# Patient Record
Sex: Male | Born: 1980 | Race: White | Hispanic: No | Marital: Single | Smoking: Never smoker
Health system: Southern US, Community
[De-identification: ages and names within clinical notes are randomized; demographics above are authoritative.]

## PROBLEM LIST (undated history)

## (undated) DIAGNOSIS — E039 Hypothyroidism, unspecified: Secondary | ICD-10-CM

## (undated) DIAGNOSIS — I10 Essential (primary) hypertension: Secondary | ICD-10-CM

## (undated) HISTORY — DX: Essential (primary) hypertension: I10

---

## 2011-04-23 ENCOUNTER — Ambulatory Visit (HOSPITAL_BASED_OUTPATIENT_CLINIC_OR_DEPARTMENT_OTHER)
Admission: RE | Admit: 2011-04-23 | Discharge: 2011-04-23 | Disposition: A | Discharge status: Home / Self Care | Payer: Commercial Indemnity | Source: Ambulatory Visit | Attending: Urology | Admitting: Urology

## 2011-04-23 DIAGNOSIS — Z01812 Encounter for preprocedural laboratory examination: Secondary | ICD-10-CM | POA: Insufficient documentation

## 2011-04-23 DIAGNOSIS — N4601 Organic azoospermia: Secondary | ICD-10-CM | POA: Insufficient documentation

## 2011-04-23 DIAGNOSIS — N468 Other male infertility: Secondary | ICD-10-CM | POA: Insufficient documentation

## 2011-04-23 LAB — POCT HEMOGLOBIN-HEMACUE: Hemoglobin: 16.9 g/dL (ref 13.0–17.0)

## 2011-05-06 NOTE — Op Note (Signed)
Todd Obrien, Todd Obrien               ACCOUNT NO.:  [id number]  MEDICAL RECORD NO.:  [id number]  LOCATION:                                 FACILITY:  PHYSICIAN:  Adric Wrede C. Vernie Ammons, M.D.  DATE OF BIRTH:  [birth year]  DATE OF PROCEDURE:  04/23/2011 DATE OF DISCHARGE:                              OPERATIVE REPORT   DATE OF OPERATION:  April 23, 2011.  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  PROCEDURE: 1. Transrectal ultrasound with interpretation. 2. Attempted transrectal ultrasound-guided seminal vesicle aspiration.  SURGEON:  [place] C. Vernie Ammons, MD  ANESTHESIA:  General.  BLOOD LOSS:  Minimal.  DRAINS:  None.  SPECIMENS:  None.  COMPLICATIONS:  None.  INDICATIONS:  The patient is a 30 year old male who was found to be azoospermic.  He had a normal pituitary-gonadal axis with a specific finding of normal FSH, LH and testosterone levels.  His ejaculate volumes are less than 1 mL.  His post ejaculate semen analysis revealed no evidence of sperm.  No abnormality was noted on physical examination. We therefore discussed seminal vesicle aspiration and seminal vesiculography with unroofing of ejaculatory duct obstruction transurethrally, if found.  The risks, complications, alternatives and limitations to this procedure were discussed.  The patient understands and has elected to proceed.  DESCRIPTION OF OPERATION:  After informed consent, the patient was brought to the major OR, placed on the table, administered general anesthesia and then moved to the dorsal lithotomy position.  I used a 10 MHz transrectal ultrasound probe and placed this within the rectum and performed transrectal ultrasound of the prostate and seminal vesicles. I found his prostate appeared normal in size for his age.  And I noted no midline cystic  structures throughout the prostate.  The seminal vesicles were visualized but were noted to be nondilated.  The 16-gauge transrectal ultrasound biopsy needle with stylet was then introduced, and under transrectal ultrasound guidance, I placed this into the area of the seminal vesicle and removed the stylet.  A syringe was then attached and I attempted to obtain fluid from the seminal vesicle, first from the left and then the right side.  Both sides revealed no significant fluid that could be obtained, although the seminal vesicles were quite small but present, and due to their nondilated nature, I was unable to obtain any fluid for microscopic evaluation.  I therefore removed the transrectal ultrasound probe and repeated a DRE and noted the prostate to be normal, no seminal vesicles were palpable, and no midline cystic structures or masses were evident to exam.  There was also no blood noted at the end of the procedure.  I discussed with the patient and his wife the findings and the fact that at this point I did not recommend proceeding with a testis biopsy or vasogram as they needed to understand the options, and if a biopsy was performed, I felt it would be advantageous to have this done at a location where a sperm could be cryopreserved if found at the time of his testis biopsy, for consideration of ICSI.  He  tolerated the procedure well and was given a prescription for Levaquin 500 mg daily for 2 days.     Evyn Putzier C. Vernie Ammons, M.D.     MCO/MEDQ  D:  04/23/2011  T:  04/23/2011  Job:  [id number]  Electronically Signed by Ihor Gully M.D. on 05/06/2011 04:23:27 AM

## 2011-08-24 HISTORY — PX: FOOT SURGERY: SHX648

## 2011-09-12 ENCOUNTER — Encounter (HOSPITAL_COMMUNITY): Payer: Self-pay | Admitting: *Deleted

## 2011-09-12 ENCOUNTER — Emergency Department (HOSPITAL_COMMUNITY)
Admission: EM | Admit: 2011-09-12 | Discharge: 2011-09-12 | Disposition: A | Discharge status: Home / Self Care | Payer: Commercial Indemnity | Source: Home / Self Care | Attending: Family Medicine | Admitting: Family Medicine

## 2011-09-12 ENCOUNTER — Emergency Department (INDEPENDENT_AMBULATORY_CARE_PROVIDER_SITE_OTHER): Payer: Commercial Indemnity

## 2011-09-12 DIAGNOSIS — R059 Cough, unspecified: Secondary | ICD-10-CM

## 2011-09-12 DIAGNOSIS — J111 Influenza due to unidentified influenza virus with other respiratory manifestations: Secondary | ICD-10-CM

## 2011-09-12 DIAGNOSIS — R509 Fever, unspecified: Secondary | ICD-10-CM

## 2011-09-12 DIAGNOSIS — R05 Cough: Secondary | ICD-10-CM

## 2011-09-12 DIAGNOSIS — J029 Acute pharyngitis, unspecified: Secondary | ICD-10-CM

## 2011-09-12 HISTORY — DX: Hypothyroidism, unspecified: E03.9

## 2011-09-12 MED ORDER — ACETAMINOPHEN 325 MG PO TABS
ORAL_TABLET | ORAL | Status: AC
  Filled 2011-09-12: qty 3

## 2011-09-12 MED ORDER — ACETAMINOPHEN 325 MG PO TABS
975.0000 mg | ORAL_TABLET | Freq: Once | ORAL | Status: AC
  Administered 2011-09-12: 975 mg via ORAL

## 2011-09-12 MED ORDER — HYDROCOD POLST-CHLORPHEN POLST 10-8 MG/5ML PO LQCR: 5.0000 mL | Freq: Two times a day (BID) | ORAL | Status: DC

## 2011-09-12 NOTE — ED Notes (Signed)
Pt with c/o fever/cough/congestion/generalized bodyaches

## 2011-09-12 NOTE — ED Provider Notes (Signed)
History     CSN: [id number]  Arrival date & time 09/12/11  1032   First MD Initiated Contact with Patient 09/12/11 1137      Chief Complaint  Patient presents with  . Generalized Body Aches  . Fever  . Nasal Congestion  . Cough    (Consider location/radiation/quality/duration/timing/severity/associated sxs/prior treatment) Patient is a 31 y.o. male presenting with fever and cough. The history is provided by the patient.  Fever Primary symptoms of the febrile illness include fever, cough and myalgias. Primary symptoms do not include wheezing, abdominal pain, nausea, vomiting or diarrhea. The current episode started yesterday. This is a new problem. The problem has been gradually worsening.  Risk factors: had flu vaccine in oct. Cough Associated symptoms include chills, rhinorrhea, sore throat and myalgias. Pertinent negatives include no wheezing.    Past Medical History  Diagnosis Date  . Hypothyroid     History reviewed. No pertinent past surgical history.  History reviewed. No pertinent family history.  History  Substance Use Topics  . Smoking status: Never Smoker   . Smokeless tobacco: Not on file  . Alcohol Use: Yes      Review of Systems  Constitutional: Positive for fever, chills and appetite change.  HENT: Positive for congestion, sore throat, rhinorrhea and postnasal drip.   Eyes: Negative.   Respiratory: Positive for cough. Negative for wheezing.   Gastrointestinal: Negative.  Negative for nausea, vomiting, abdominal pain and diarrhea.  Genitourinary: Negative.   Musculoskeletal: Positive for myalgias.    Allergies  Review of patient's allergies indicates no known allergies.  Home Medications   Current Outpatient Rx  Name Route Sig Dispense Refill  . IBUPROFEN 400 MG PO TABS Oral Take 400 mg by mouth every 6 (six) hours as needed.    Marland Kitchen LEVOTHYROXINE SODIUM 175 MCG PO TABS Oral Take 175 mcg by mouth daily.    Marland Kitchen HYDROCOD POLST-CPM POLST ER 10-8  MG/5ML PO LQCR Oral Take 5 mLs by mouth every 12 (twelve) hours. 115 mL 0    BP 131/81  Pulse 134  Temp(Src) 99 F (37.2 C) (Oral)  Resp 22  SpO2 95%  Physical Exam  Nursing note and vitals reviewed. Constitutional: He is oriented to person, place, and time. He appears well-developed and well-nourished.  HENT:  Head: Normocephalic.  Right Ear: External ear normal.  Left Ear: External ear normal.  Nose: Nose normal.  Mouth/Throat: Oropharynx is clear and moist.  Eyes: Conjunctivae and EOM are normal. Pupils are equal, round, and reactive to light.  Neck: Normal range of motion. Neck supple.  Cardiovascular: Normal rate, normal heart sounds and intact distal pulses.   Pulmonary/Chest: Effort normal and breath sounds normal.  Abdominal: Soft. Bowel sounds are normal.  Lymphadenopathy:    He has no cervical adenopathy.  Neurological: He is alert and oriented to person, place, and time.  Skin: Skin is warm and dry.  Psychiatric: He has a normal mood and affect.    ED Course  Procedures (including critical care time)  Labs Reviewed - No data to display Dg Chest 2 View  09/12/2011  *RADIOLOGY REPORT*  Clinical Data: Cough, fever  CHEST - 2 VIEW  Comparison: None.  Findings: Lungs are clear. No pleural effusion or pneumothorax.  Cardiomediastinal silhouette is within normal limits.  Visualized osseous structures are within normal limits.  IMPRESSION: Normal chest radiographs.  Original Report Authenticated By: Charline Bills, M.D.     1. Influenza-like illness       MDM  X-rays reviewed and report per radiologist.         Barkley Bruns, MD 09/12/11 [phone]

## 2011-09-14 ENCOUNTER — Telehealth (HOSPITAL_COMMUNITY): Payer: Self-pay | Admitting: *Deleted

## 2014-04-17 ENCOUNTER — Other Ambulatory Visit: Payer: Self-pay | Admitting: Family Medicine

## 2014-04-17 DIAGNOSIS — R1011 Right upper quadrant pain: Secondary | ICD-10-CM

## 2014-04-22 ENCOUNTER — Ambulatory Visit
Admission: RE | Admit: 2014-04-22 | Discharge: 2014-04-22 | Disposition: A | Discharge status: Home / Self Care | Payer: Commercial Indemnity | Source: Ambulatory Visit | Attending: Family Medicine | Admitting: Family Medicine

## 2014-04-22 DIAGNOSIS — R1011 Right upper quadrant pain: Secondary | ICD-10-CM

## 2015-10-28 IMAGING — US US ABDOMEN LIMITED
1 series · 14 of 25 positions shown · non-contrast
Comparison: None.

CLINICAL DATA: Right upper quadrant pain. Evaluate for
cholelithiasis.

EXAM:
US ABDOMEN LIMITED - RIGHT UPPER QUADRANT

[Series 1: us abdomen limited · 0.32mm/px · 14 of 34 slices shown]
[im 1/34]
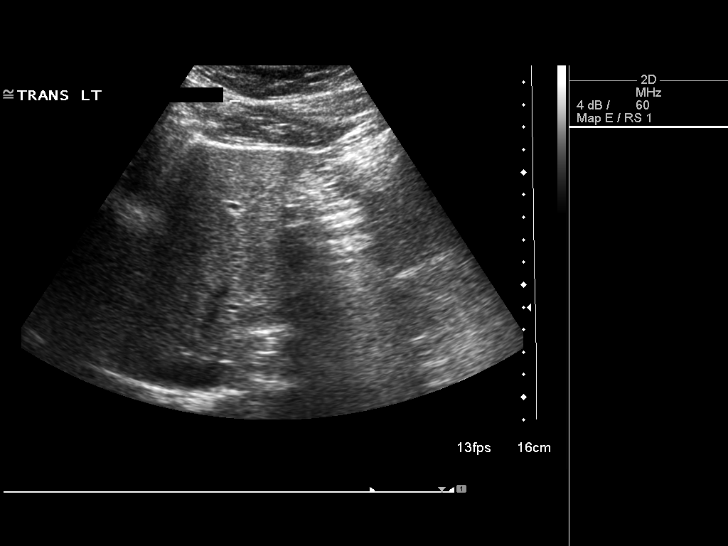
[im 3/34]
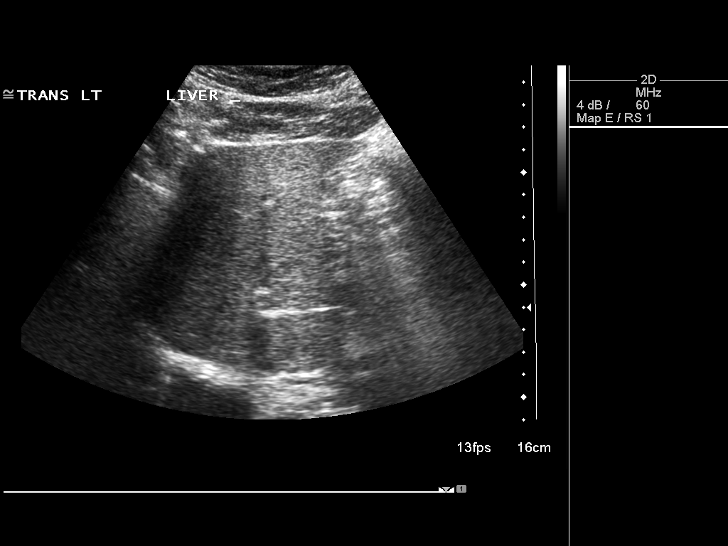
[im 6/34]
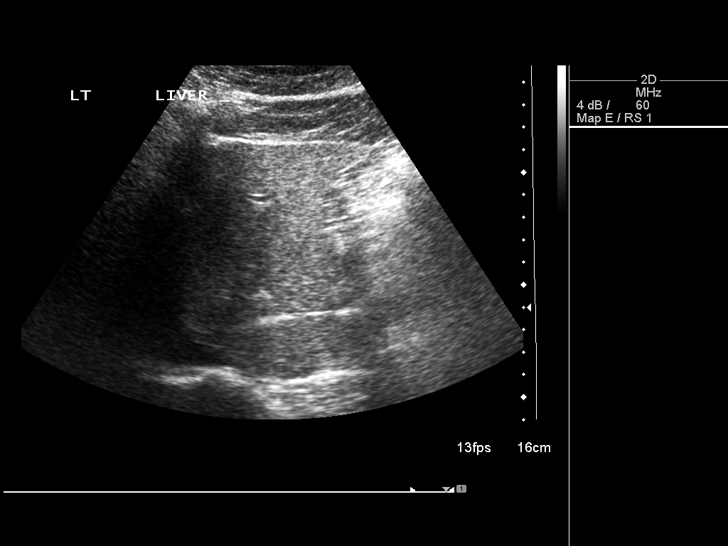
[im 9/34]
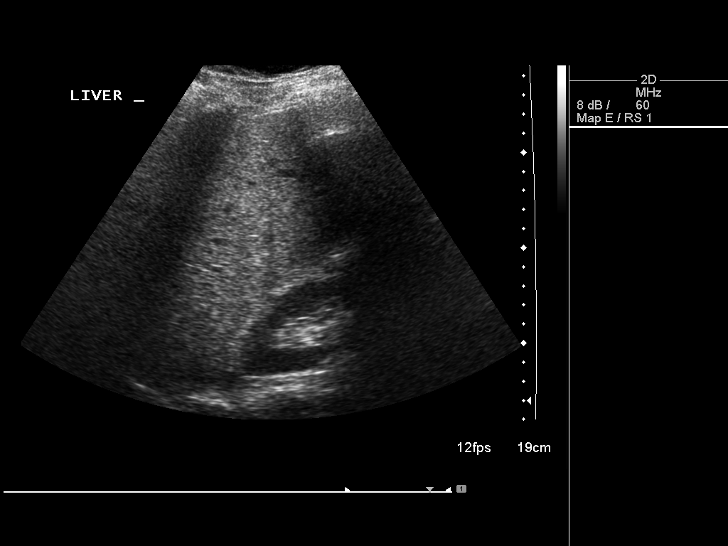
[im 12/34]
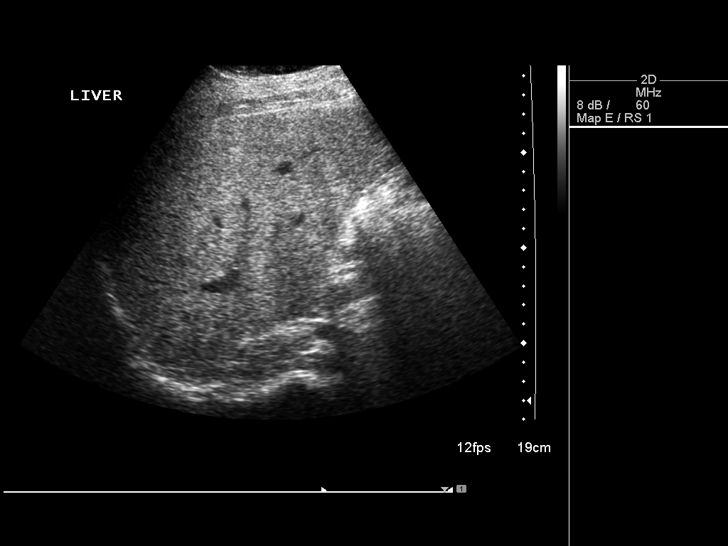
[im 13/34]
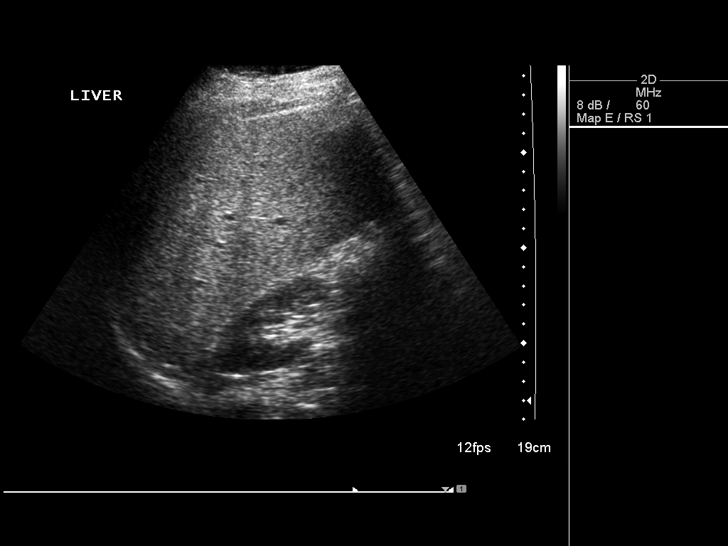
[im 16/34]
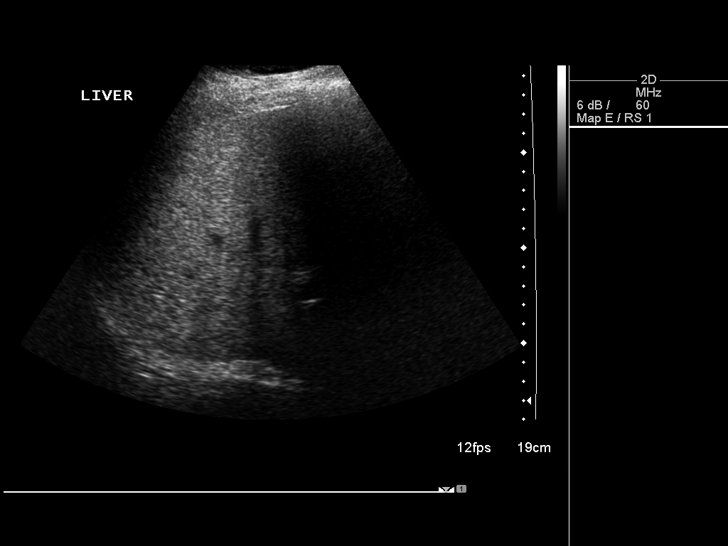
[im 18/34]
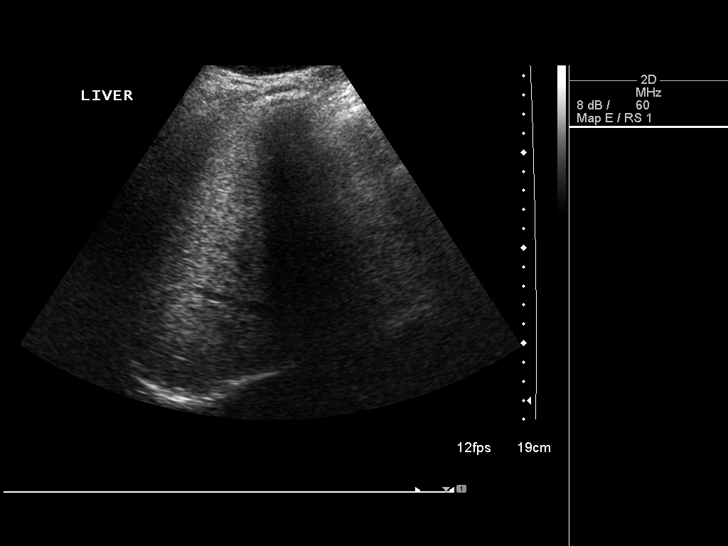
[im 21/34]
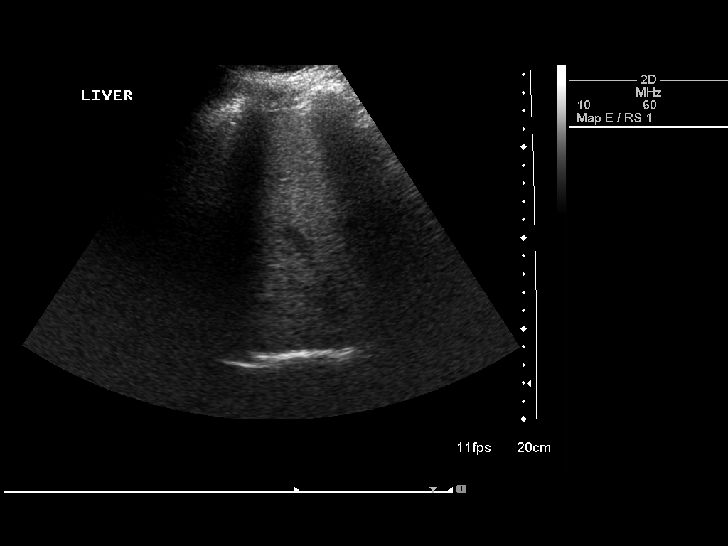
[im 23/34]
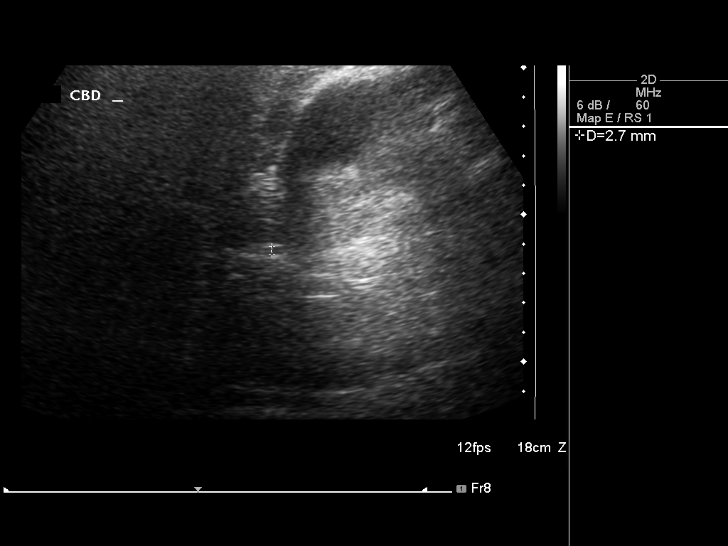
[im 25/34]
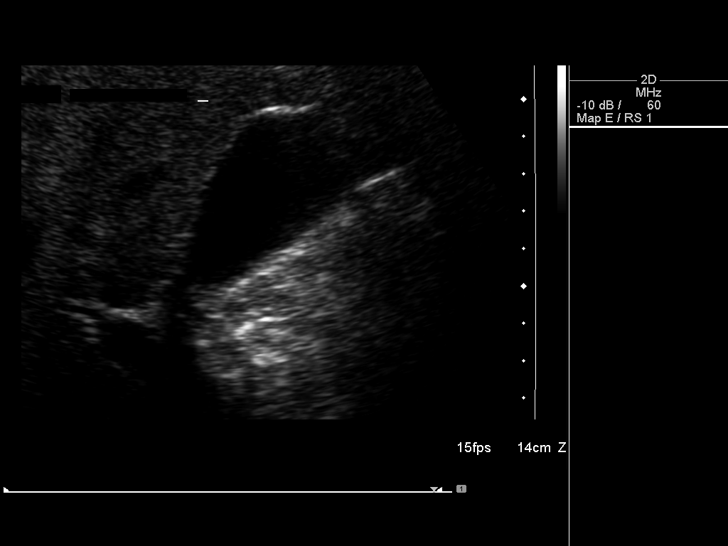
[im 28/34]
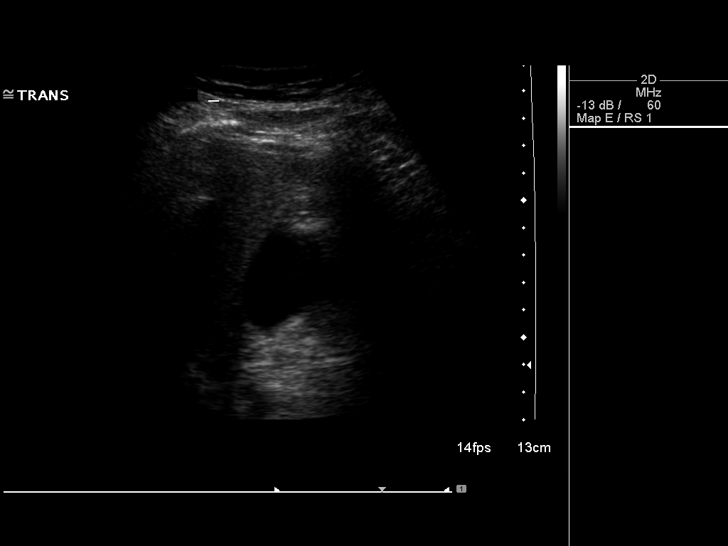
[im 31/34]
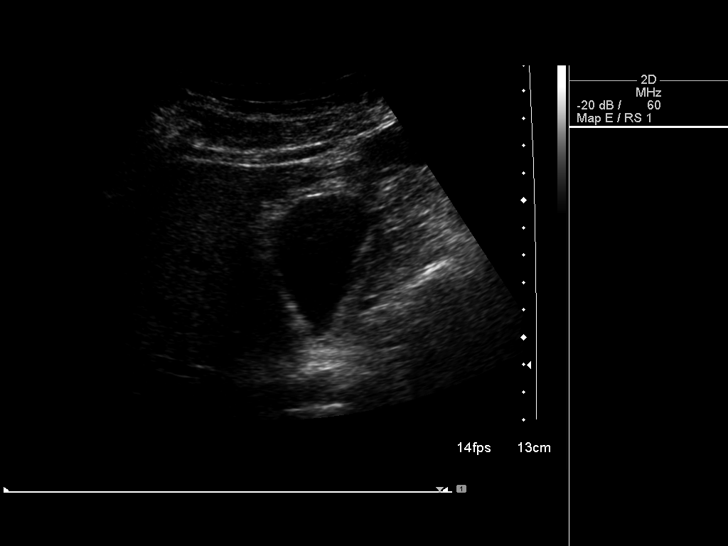
[im 34/34]
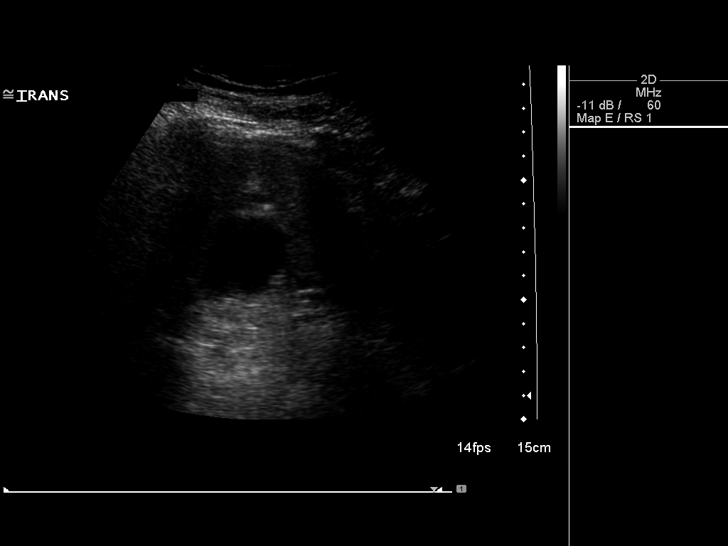

[14 of 25 positions shown; findings below may reference images not displayed]

FINDINGS: Gallbladder:

No gallstones or wall thickening visualized. No sonographic Murphy
sign noted.

Common bile duct:

Diameter: 3 mm

Liver:

No focal lesion identified. Mildly echogenic liver, although
acoustic transmission remains good. Antegrade flow in the imaged
portal venous system.
IMPRESSION: Negative right upper quadrant ultrasound.  No cholelithiasis.

## 2017-06-10 ENCOUNTER — Other Ambulatory Visit: Payer: Self-pay | Admitting: Family Medicine

## 2017-06-10 DIAGNOSIS — R945 Abnormal results of liver function studies: Principal | ICD-10-CM

## 2017-06-10 DIAGNOSIS — R7989 Other specified abnormal findings of blood chemistry: Secondary | ICD-10-CM

## 2017-06-20 ENCOUNTER — Ambulatory Visit
Admission: RE | Admit: 2017-06-20 | Discharge: 2017-06-20 | Disposition: A | Discharge status: Home / Self Care | Payer: Managed Care, Other (non HMO) | Source: Ambulatory Visit | Attending: Family Medicine | Admitting: Family Medicine

## 2017-06-20 DIAGNOSIS — R7989 Other specified abnormal findings of blood chemistry: Secondary | ICD-10-CM

## 2017-06-20 DIAGNOSIS — R945 Abnormal results of liver function studies: Principal | ICD-10-CM

## 2017-08-18 ENCOUNTER — Encounter: Payer: Self-pay | Admitting: Family Medicine

## 2017-09-27 ENCOUNTER — Encounter: Payer: Self-pay | Admitting: Family Medicine

## 2017-10-17 ENCOUNTER — Encounter: Payer: Self-pay | Admitting: Family Medicine

## 2019-12-21 ENCOUNTER — Other Ambulatory Visit: Payer: Self-pay | Admitting: Family Medicine

## 2019-12-21 DIAGNOSIS — R7989 Other specified abnormal findings of blood chemistry: Secondary | ICD-10-CM

## 2019-12-25 ENCOUNTER — Other Ambulatory Visit: Payer: Managed Care, Other (non HMO)

## 2020-01-22 ENCOUNTER — Ambulatory Visit
Admission: RE | Admit: 2020-01-22 | Discharge: 2020-01-22 | Disposition: A | Discharge status: Home / Self Care | Payer: Managed Care, Other (non HMO) | Source: Ambulatory Visit | Attending: Family Medicine | Admitting: Family Medicine

## 2020-01-22 DIAGNOSIS — R7989 Other specified abnormal findings of blood chemistry: Secondary | ICD-10-CM

## 2020-04-23 ENCOUNTER — Encounter: Payer: Self-pay | Admitting: Gastroenterology

## 2020-05-12 DIAGNOSIS — E039 Hypothyroidism, unspecified: Secondary | ICD-10-CM | POA: Diagnosis not present

## 2020-05-12 DIAGNOSIS — E782 Mixed hyperlipidemia: Secondary | ICD-10-CM | POA: Diagnosis not present

## 2020-05-12 DIAGNOSIS — I10 Essential (primary) hypertension: Secondary | ICD-10-CM | POA: Diagnosis not present

## 2020-06-10 DIAGNOSIS — E782 Mixed hyperlipidemia: Secondary | ICD-10-CM | POA: Diagnosis not present

## 2020-06-10 DIAGNOSIS — Z1159 Encounter for screening for other viral diseases: Secondary | ICD-10-CM | POA: Diagnosis not present

## 2020-06-10 DIAGNOSIS — Z0000 Encounter for general adult medical examination without abnormal findings: Secondary | ICD-10-CM | POA: Diagnosis not present

## 2020-06-11 DIAGNOSIS — Z0000 Encounter for general adult medical examination without abnormal findings: Secondary | ICD-10-CM | POA: Diagnosis not present

## 2020-06-11 DIAGNOSIS — Z23 Encounter for immunization: Secondary | ICD-10-CM | POA: Diagnosis not present

## 2020-06-30 ENCOUNTER — Ambulatory Visit: Payer: BC Managed Care – PPO | Admitting: Gastroenterology

## 2020-06-30 ENCOUNTER — Other Ambulatory Visit (INDEPENDENT_AMBULATORY_CARE_PROVIDER_SITE_OTHER): Payer: BC Managed Care – PPO

## 2020-06-30 ENCOUNTER — Encounter: Payer: Self-pay | Admitting: Gastroenterology

## 2020-06-30 VITALS — BP 114/70 | HR 98 | Ht 70.0 in | Wt 238.4 lb

## 2020-06-30 DIAGNOSIS — K760 Fatty (change of) liver, not elsewhere classified: Secondary | ICD-10-CM

## 2020-06-30 LAB — HEPATIC FUNCTION PANEL
ALT: 122 U/L — ABNORMAL HIGH (ref 0–53)
AST: 70 U/L — ABNORMAL HIGH (ref 0–37)
Albumin: 3.6 g/dL (ref 3.5–5.2)
Alkaline Phosphatase: 60 U/L (ref 39–117)
Bilirubin, Direct: 0.1 mg/dL (ref 0.0–0.3)
Total Bilirubin: 0.9 mg/dL (ref 0.2–1.2)
Total Protein: 6.6 g/dL (ref 6.0–8.3)

## 2020-06-30 LAB — FERRITIN: Ferritin: 192.5 ng/mL (ref 22.0–322.0)

## 2020-06-30 LAB — SEDIMENTATION RATE: Sed Rate: -8 mm/hr — ABNORMAL LOW (ref 0–15)

## 2020-06-30 NOTE — Patient Instructions (Signed)
You have been scheduled for an [place] Elastography  at [facility] (1st floor of hospital) on 07/04/2020 at 10:30am. Please arrive 15 minutes prior to your appointment for registration. Make certain not to have anything to eat or drink after midnight prior to your appointment. Should you need to reschedule your appointment, please contact radiology at [phone]. This test typically takes about 30 minutes to perform.  If you are age 80 or older, your body mass index should be between 23-30. Your Body mass index is 34.21 kg/m. If this is out of the aforementioned range listed, please consider follow up with your Primary Care Provider.  If you are age 56 or younger, your body mass index should be between 19-25. Your Body mass index is 34.21 kg/m. If this is out of the aformentioned range listed, please consider follow up with your Primary Care Provider.   Take Vitamin E capsules 500IU daily  Do a Low carb, low fat diet  AVOID soda and alcohol, NSAIDS and high fructose corn syrup    Due to recent changes in healthcare laws, you may see the results of your imaging and laboratory studies on MyChart before your provider has had a chance to review them.  We understand that in some cases there may be results that are confusing or concerning to you. Not all laboratory results come back in the same time frame and the provider may be waiting for multiple results in order to interpret others.  Please give [place] 48 hours in order for your provider to thoroughly review all the results before contacting the office for clarification of your results.   Your provider has requested that you go to the basement level for lab work before leaving today. Press "B" on the elevator. The lab is located at the first door on the left as you exit the elevator.  I appreciate the  opportunity to care for you  Thank You   Marsa Aris , MD

## 2020-06-30 NOTE — Progress Notes (Signed)
Todd Obrien    [id number]    Nov 08, 1980  Primary Care Physician:Dewey, Mechele Claude, MD  Referring Physician: Fanny Bien, MD [address] [place],  [place] [address]   Chief complaint: Fatty Liver  HPI:  39 year old very pleasant gentleman here for new patient visit for evaluation of fatty liver  He recalls being diagnosed with fatty liver many years ago.  He drinks mostly on weekends, 10-12 beer per week He also drinks 2-3 soda per day  Most recent labs not available to review during this visit but according to patient his LFTs were improving .  Denies any nausea, vomiting, abdominal pain, melena or bright red blood per rectum.  He has gained weight in the past few years.  He does not actively exercise  No family history of liver disease or GI malignancy  Abdominal ultrasound January 22, 2020: Increased echogenicity of hepatic parenchyma suggestive of hepatic steatosis  Reviewed labs in Care Everywhere Negative for acute and chronic hepatitis panel (hepatitis A, B, C and E] Negative ANA and anti-smooth muscle antibody  Outpatient Encounter Medications as of 06/30/2020  Medication Sig  . ibuprofen (ADVIL,MOTRIN) 400 MG tablet Take 400 mg by mouth every 6 (six) hours as needed.  Marland Kitchen levothyroxine (SYNTHROID, LEVOTHROID) 175 MCG tablet Take 175 mcg by mouth daily.  . MULTIPLE VITAMIN PO Take 1 tablet by mouth daily.  . [DISCONTINUED] chlorpheniramine-HYDROcodone (TUSSIONEX PENNKINETIC ER) 10-8 MG/5ML LQCR Take 5 mLs by mouth every 12 (twelve) hours.   No facility-administered encounter medications on file as of 06/30/2020.    Allergies as of 06/30/2020  . (No Known Allergies)    Past Medical History:  Diagnosis Date  . Hypothyroid     No past surgical history on file.  No family history on file.  Social History   Socioeconomic History  . Marital status: Single    Spouse name: Not on file  . Number of children: Not on file  . Years of  education: Not on file  . Highest education level: Not on file  Occupational History  . Not on file  Tobacco Use  . Smoking status: Never Smoker  Substance and Sexual Activity  . Alcohol use: Yes  . Drug use: No  . Sexual activity: Not on file  Other Topics Concern  . Not on file  Social History Narrative  . Not on file   Social Determinants of Health   Financial Resource Strain:   . Difficulty of Paying Living Expenses: Not on file  Food Insecurity:   . Worried About Charity fundraiser in the Last Year: Not on file  . Ran Out of Food in the Last Year: Not on file  Transportation Needs:   . Lack of Transportation (Medical): Not on file  . Lack of Transportation (Non-Medical): Not on file  Physical Activity:   . Days of Exercise per Week: Not on file  . Minutes of Exercise per Session: Not on file  Stress:   . Feeling of Stress : Not on file  Social Connections:   . Frequency of Communication with Friends and Family: Not on file  . Frequency of Social Gatherings with Friends and Family: Not on file  . Attends Religious Services: Not on file  . Active Member of Clubs or Organizations: Not on file  . Attends Archivist Meetings: Not on file  . Marital Status: Not on file  Intimate Partner Violence:   .  Fear of Current or Ex-Partner: Not on file  . Emotionally Abused: Not on file  . Physically Abused: Not on file  . Sexually Abused: Not on file      Review of systems: All other review of systems negative except as mentioned in the HPI.   Physical Exam: Vitals:   06/30/20 1350  BP: 114/70  Pulse: 98  SpO2: 96%   Body mass index is 34.21 kg/m. 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  Avoid soda or drinks with high fructose corn syrup or artificial sweeteners Exercise daily for 15 to 20 minutes  Check LFT, ESR, ferritin, ceruloplasmin, alpha-1 antitrypsin, antimitochondrial antibody to exclude any other etiology for chronic liver disease  Obtain ultrasound with elastography to evaluate degree of fibrosis  Start vitamin E capsules 400 international units daily  Repeat LFT in 6 months with Dr. Ernie Hew  Return in 1 to 2 years    The patient was provided an opportunity to ask questions and all were answered. The patient agreed with the plan and demonstrated an understanding of the instructions.  Damaris Hippo , MD    CC: Fanny Bien, MD

## 2020-07-01 LAB — MITOCHONDRIAL ANTIBODIES: Mitochondrial M2 Ab, IgG: <=20 U

## 2020-07-01 LAB — ALPHA-1-ANTITRYPSIN: A-1 Antitrypsin, Ser: 150 mg/dL (ref 83–199)

## 2020-07-01 LAB — CERULOPLASMIN: Ceruloplasmin: 26 mg/dL (ref 18–36)

## 2020-07-04 ENCOUNTER — Ambulatory Visit (HOSPITAL_COMMUNITY): Payer: BC Managed Care – PPO

## 2020-07-28 ENCOUNTER — Ambulatory Visit (HOSPITAL_COMMUNITY)
Admission: RE | Admit: 2020-07-28 | Discharge: 2020-07-28 | Disposition: A | Discharge status: Home / Self Care | Payer: BC Managed Care – PPO | Source: Ambulatory Visit | Attending: Gastroenterology | Admitting: Gastroenterology

## 2020-07-28 ENCOUNTER — Other Ambulatory Visit: Payer: Self-pay

## 2020-07-28 DIAGNOSIS — K760 Fatty (change of) liver, not elsewhere classified: Secondary | ICD-10-CM | POA: Diagnosis not present

## 2021-04-07 DIAGNOSIS — E782 Mixed hyperlipidemia: Secondary | ICD-10-CM | POA: Diagnosis not present

## 2021-04-07 DIAGNOSIS — K760 Fatty (change of) liver, not elsewhere classified: Secondary | ICD-10-CM | POA: Diagnosis not present

## 2021-04-07 DIAGNOSIS — E039 Hypothyroidism, unspecified: Secondary | ICD-10-CM | POA: Diagnosis not present

## 2021-04-07 DIAGNOSIS — R7989 Other specified abnormal findings of blood chemistry: Secondary | ICD-10-CM | POA: Diagnosis not present

## 2021-05-04 DIAGNOSIS — R739 Hyperglycemia, unspecified: Secondary | ICD-10-CM | POA: Diagnosis not present

## 2021-05-04 DIAGNOSIS — R945 Abnormal results of liver function studies: Secondary | ICD-10-CM | POA: Diagnosis not present

## 2021-05-04 DIAGNOSIS — E782 Mixed hyperlipidemia: Secondary | ICD-10-CM | POA: Diagnosis not present

## 2021-05-04 DIAGNOSIS — E039 Hypothyroidism, unspecified: Secondary | ICD-10-CM | POA: Diagnosis not present

## 2021-05-07 DIAGNOSIS — K760 Fatty (change of) liver, not elsewhere classified: Secondary | ICD-10-CM | POA: Diagnosis not present

## 2021-05-07 DIAGNOSIS — E039 Hypothyroidism, unspecified: Secondary | ICD-10-CM | POA: Diagnosis not present

## 2021-05-07 DIAGNOSIS — E669 Obesity, unspecified: Secondary | ICD-10-CM | POA: Diagnosis not present

## 2021-05-07 DIAGNOSIS — E782 Mixed hyperlipidemia: Secondary | ICD-10-CM | POA: Diagnosis not present

## 2021-06-08 DIAGNOSIS — E669 Obesity, unspecified: Secondary | ICD-10-CM | POA: Diagnosis not present

## 2021-06-08 DIAGNOSIS — K760 Fatty (change of) liver, not elsewhere classified: Secondary | ICD-10-CM | POA: Diagnosis not present

## 2021-06-08 DIAGNOSIS — E039 Hypothyroidism, unspecified: Secondary | ICD-10-CM | POA: Diagnosis not present

## 2021-06-08 DIAGNOSIS — R7989 Other specified abnormal findings of blood chemistry: Secondary | ICD-10-CM | POA: Diagnosis not present

## 2021-07-03 DIAGNOSIS — E039 Hypothyroidism, unspecified: Secondary | ICD-10-CM | POA: Diagnosis not present

## 2021-07-03 DIAGNOSIS — R7989 Other specified abnormal findings of blood chemistry: Secondary | ICD-10-CM | POA: Diagnosis not present

## 2021-07-07 DIAGNOSIS — E669 Obesity, unspecified: Secondary | ICD-10-CM | POA: Diagnosis not present

## 2021-07-07 DIAGNOSIS — E559 Vitamin D deficiency, unspecified: Secondary | ICD-10-CM | POA: Diagnosis not present

## 2021-07-07 DIAGNOSIS — E039 Hypothyroidism, unspecified: Secondary | ICD-10-CM | POA: Diagnosis not present

## 2021-07-07 DIAGNOSIS — R7989 Other specified abnormal findings of blood chemistry: Secondary | ICD-10-CM | POA: Diagnosis not present

## 2021-08-25 DIAGNOSIS — H35413 Lattice degeneration of retina, bilateral: Secondary | ICD-10-CM | POA: Diagnosis not present

## 2021-08-25 DIAGNOSIS — H43823 Vitreomacular adhesion, bilateral: Secondary | ICD-10-CM | POA: Diagnosis not present

## 2021-08-25 DIAGNOSIS — H33323 Round hole, bilateral: Secondary | ICD-10-CM | POA: Diagnosis not present

## 2021-09-22 DIAGNOSIS — H33322 Round hole, left eye: Secondary | ICD-10-CM | POA: Diagnosis not present

## 2021-11-17 DIAGNOSIS — E039 Hypothyroidism, unspecified: Secondary | ICD-10-CM | POA: Diagnosis not present

## 2021-11-17 DIAGNOSIS — E559 Vitamin D deficiency, unspecified: Secondary | ICD-10-CM | POA: Diagnosis not present

## 2021-11-20 DIAGNOSIS — E782 Mixed hyperlipidemia: Secondary | ICD-10-CM | POA: Diagnosis not present

## 2021-11-20 DIAGNOSIS — E039 Hypothyroidism, unspecified: Secondary | ICD-10-CM | POA: Diagnosis not present

## 2021-11-20 DIAGNOSIS — E559 Vitamin D deficiency, unspecified: Secondary | ICD-10-CM | POA: Diagnosis not present

## 2021-11-20 DIAGNOSIS — E781 Pure hyperglyceridemia: Secondary | ICD-10-CM | POA: Diagnosis not present

## 2021-11-26 DIAGNOSIS — R739 Hyperglycemia, unspecified: Secondary | ICD-10-CM | POA: Diagnosis not present

## 2022-01-25 DIAGNOSIS — E039 Hypothyroidism, unspecified: Secondary | ICD-10-CM | POA: Diagnosis not present

## 2022-01-25 DIAGNOSIS — R7989 Other specified abnormal findings of blood chemistry: Secondary | ICD-10-CM | POA: Diagnosis not present

## 2022-01-25 DIAGNOSIS — E782 Mixed hyperlipidemia: Secondary | ICD-10-CM | POA: Diagnosis not present

## 2022-01-27 DIAGNOSIS — E782 Mixed hyperlipidemia: Secondary | ICD-10-CM | POA: Diagnosis not present

## 2022-01-27 DIAGNOSIS — E559 Vitamin D deficiency, unspecified: Secondary | ICD-10-CM | POA: Diagnosis not present

## 2022-01-27 DIAGNOSIS — R7989 Other specified abnormal findings of blood chemistry: Secondary | ICD-10-CM | POA: Diagnosis not present

## 2022-01-27 DIAGNOSIS — E039 Hypothyroidism, unspecified: Secondary | ICD-10-CM | POA: Diagnosis not present

## 2022-02-02 IMAGING — US US LIVER ELASTOGRAPHY
1 of 2 series · 12 of 25 positions shown · non-contrast
Comparison: Abdominal ultrasound 01/22/2020

CLINICAL DATA: Fatty liver disease

EXAM:
US LIVER ELASTOGRAPHY
TECHNIQUE: Sonography of the liver was performed. In addition, ultrasound
elastography evaluation of the liver was performed. A region of
interest was placed within the right lobe of the liver. Following
application of a compressive sonographic pulse, tissue
compressibility was assessed. Multiple assessments were performed at
the selected site. Median tissue compressibility was determined.
Previously, hepatic stiffness was assessed by shear wave velocity.
Based on recently published Society of Radiologists in Ultrasound
consensus article, reporting is now recommended to be performed in
the SI units of pressure (kiloPascals) representing hepatic
stiffness/elasticity. The obtained result is compared to the
published reference standards. (cACLD = compensated Advanced Chronic
Liver Disease)

[Series 1: us liver elastography · 12 of 36 slices shown]
[im 2/36]
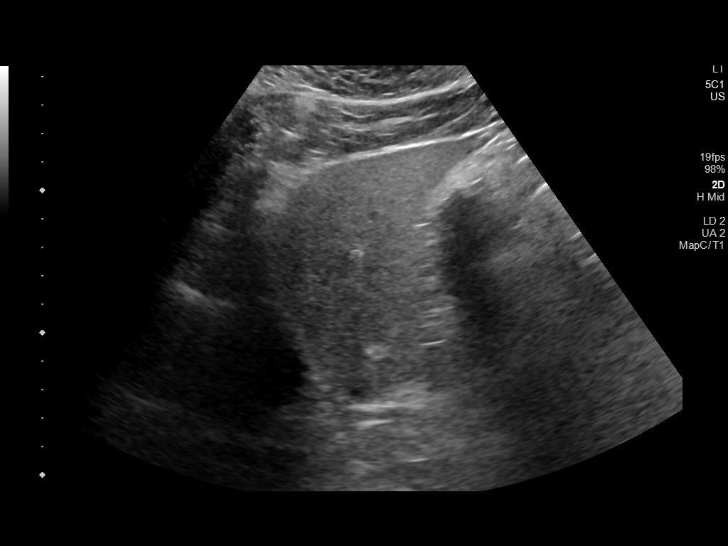
[im 5/36]
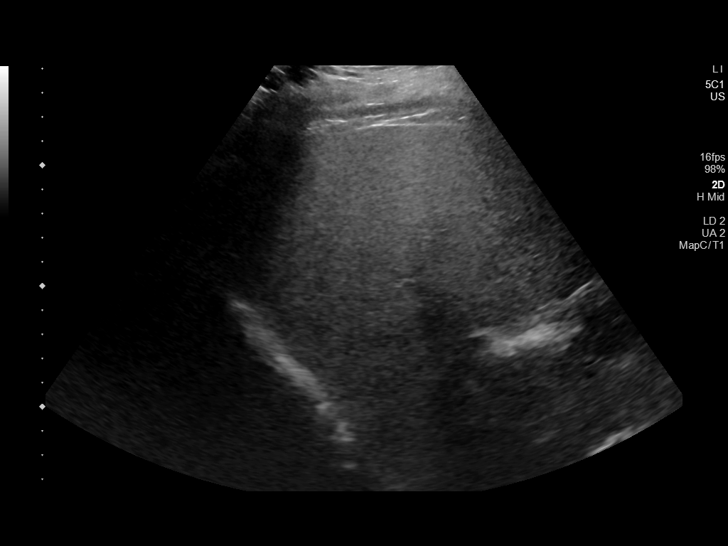
[im 8/36]
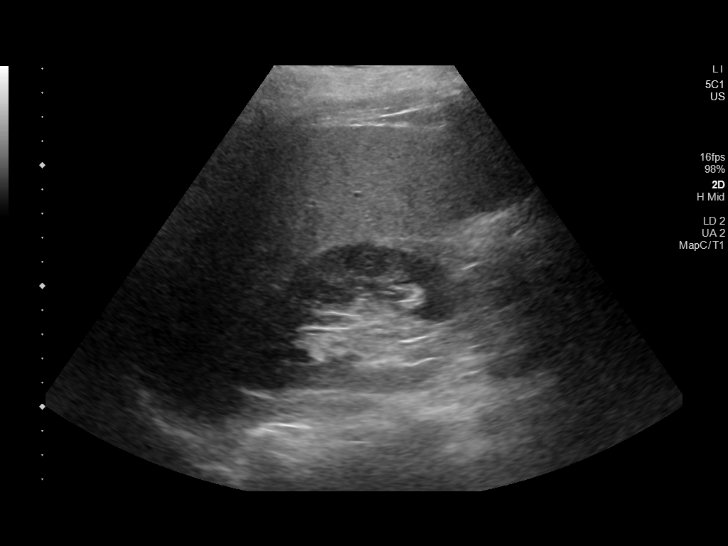
[im 11/36]
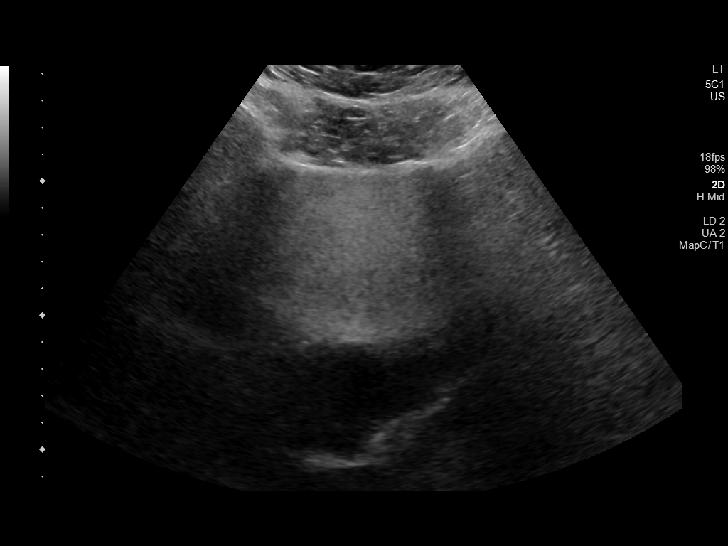
[im 14/36]
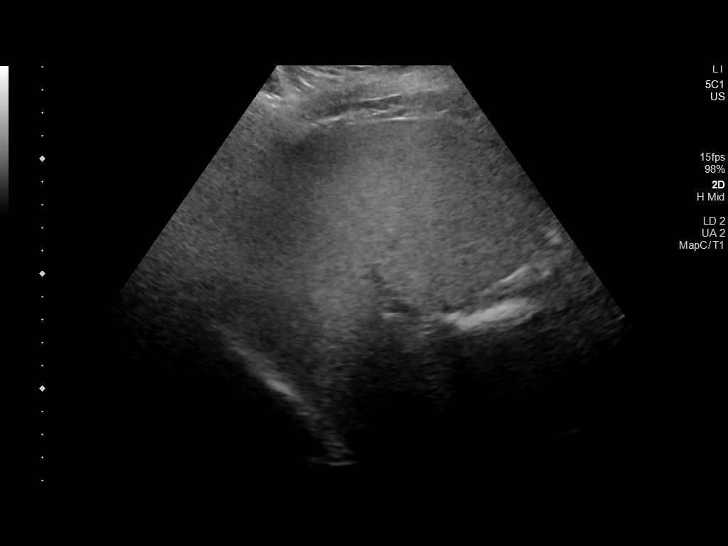
[im 17/36]
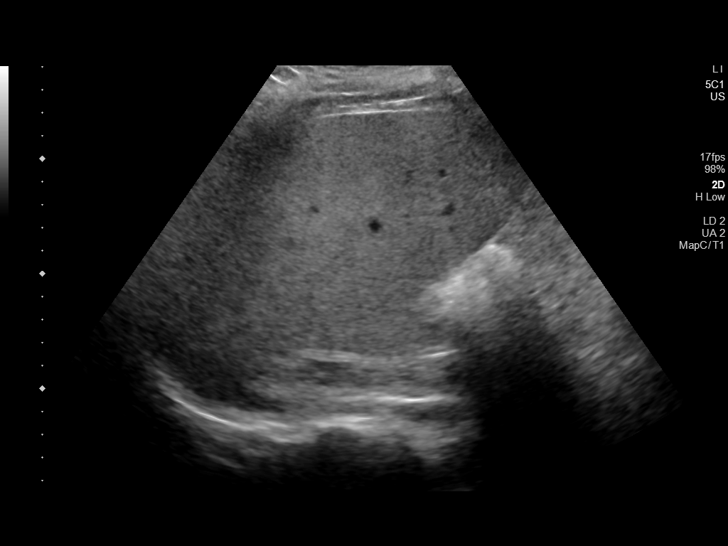
[im 20/36]
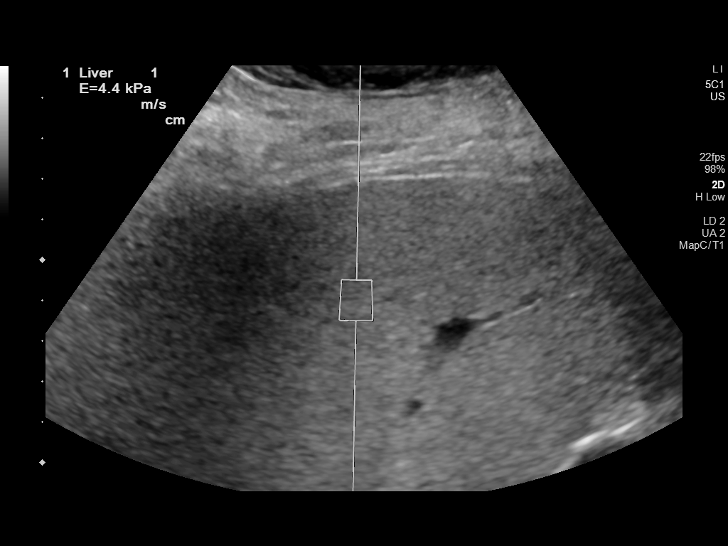
[im 23/36]
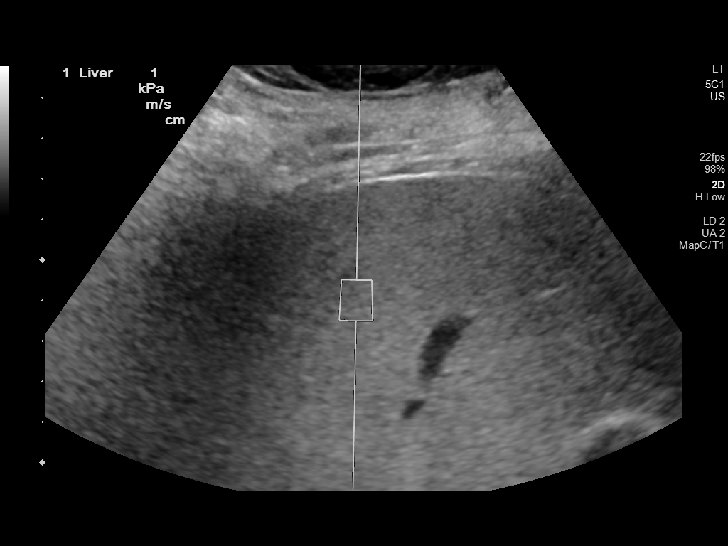
[im 26/36]
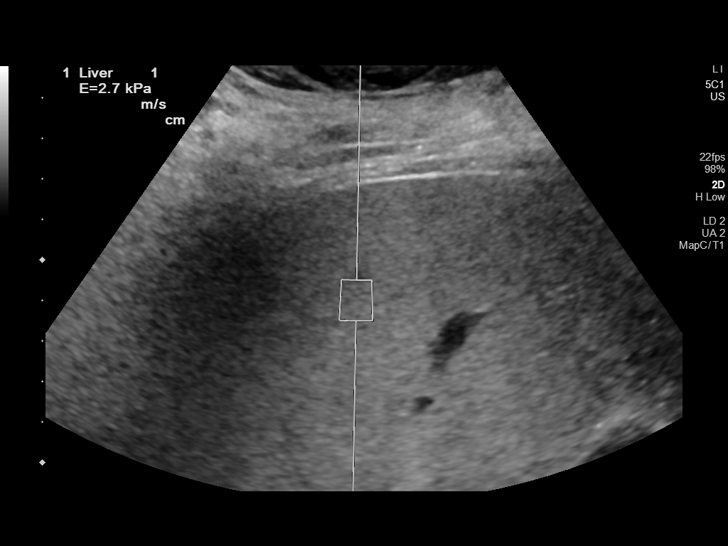
[im 29/36]
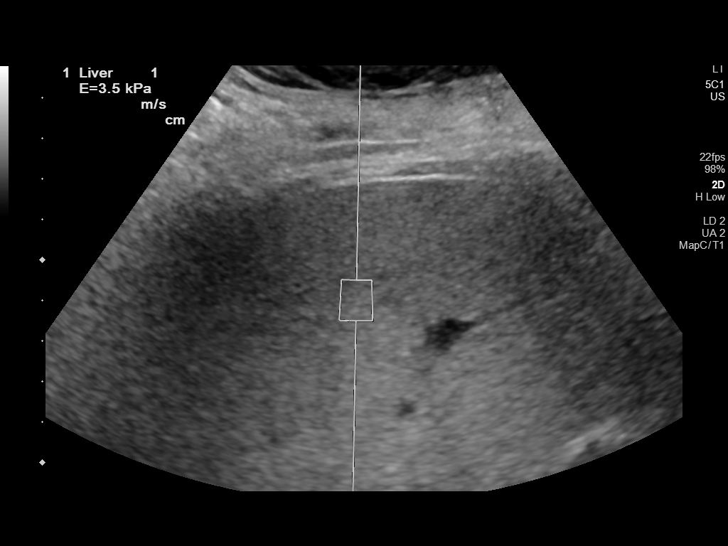
[im 32/36]
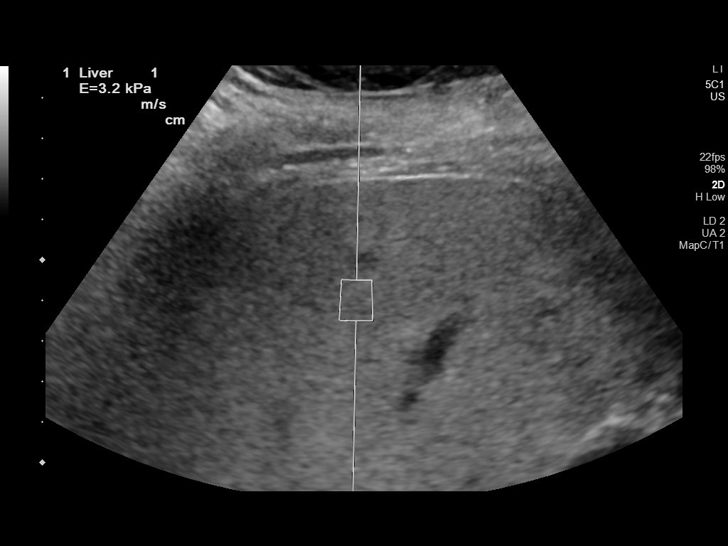
[im 36/36]
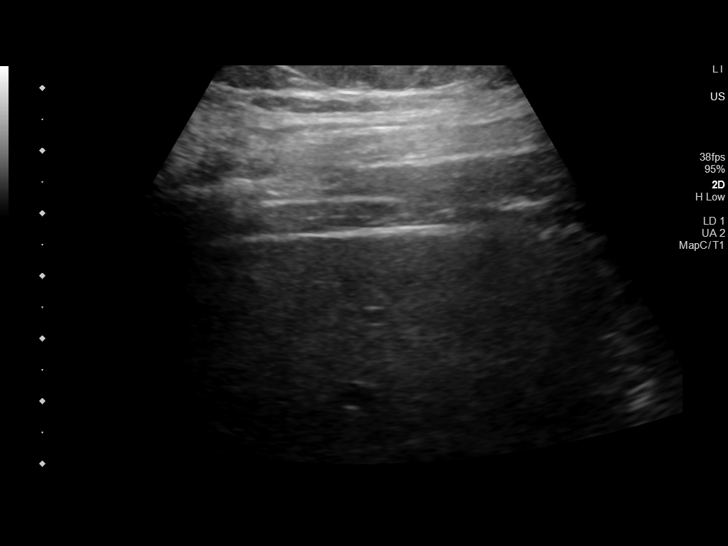

[12 of 25 positions shown; findings below may reference images not displayed]

FINDINGS: Liver: Echogenic parenchyma, likely fatty infiltration though this
can be seen with cirrhosis and certain infiltrative disorders. No
focal hepatic mass or nodularity. No intrahepatic biliary
dilatation. Portal vein is patent on color Doppler imaging with
normal direction of blood flow towards the liver.

ULTRASOUND HEPATIC ELASTOGRAPHY

Device: Siemens Helix VTQ

Patient position: Supine

Transducer: [id number]

Number of measurements: 10

Hepatic segment:  8

Median kPa:

IQR:

IQR/Median kPa ratio:

Data quality:  Good

Diagnostic category:  < or = 5 kPa: high probability of being normal

The use of hepatic elastography is applicable to patients with viral
hepatitis and non-alcoholic fatty liver disease. At this time, there
is insufficient data for the referenced cut-off values and use in
other causes of liver disease, including alcoholic liver disease.
Patients, however, may be assessed by elastography and serve as
their own reference standard/baseline.

In patients with non-alcoholic liver disease, the values suggesting
compensated advanced chronic liver disease (cACLD) may be lower, and
patients may need additional testing with elasticity results of [DATE]
kPa.

Please note that abnormal hepatic elasticity and shear wave
velocities may also be identified in clinical settings other than
with hepatic fibrosis, such as: acute hepatitis, elevated right
heart and central venous pressures including use of beta blockers,
Adlaho disease ([place]), infiltrative processes such as
mastocytosis/amyloidosis/infiltrative tumor/lymphoma, extrahepatic
cholestasis, with hyperemia in the post-prandial state, and with
liver transplantation. Correlation with patient history, laboratory
data, and clinical condition recommended.

Diagnostic Categories:

< or =5 kPa: high probability of being normal

< or =9 kPa: in the absence of other known clinical signs, rules [DATE] kPa and ?13 kPa: suggestive of cACLD, but needs further testing

>13 kPa: highly suggestive of cACLD

> or =17 kPa: highly suggestive of cACLD with an increased
probability of clinically significant portal hypertension
IMPRESSION: ULTRASOUND LIVER:

Probable fatty infiltration of liver as above.

No focal hepatic mass or nodularity.

ULTRASOUND HEPATIC ELASTOGRAPHY:

Median kPa:

Diagnostic category:  < or = 5 kPa: high probability of being normal

## 2022-03-25 DIAGNOSIS — Z114 Encounter for screening for human immunodeficiency virus [HIV]: Secondary | ICD-10-CM | POA: Diagnosis not present

## 2022-03-25 DIAGNOSIS — E039 Hypothyroidism, unspecified: Secondary | ICD-10-CM | POA: Diagnosis not present

## 2022-03-25 DIAGNOSIS — Z13220 Encounter for screening for lipoid disorders: Secondary | ICD-10-CM | POA: Diagnosis not present

## 2022-03-25 DIAGNOSIS — E559 Vitamin D deficiency, unspecified: Secondary | ICD-10-CM | POA: Diagnosis not present

## 2022-03-25 DIAGNOSIS — Z0000 Encounter for general adult medical examination without abnormal findings: Secondary | ICD-10-CM | POA: Diagnosis not present

## 2022-03-31 DIAGNOSIS — E063 Autoimmune thyroiditis: Secondary | ICD-10-CM | POA: Diagnosis not present

## 2022-03-31 DIAGNOSIS — E039 Hypothyroidism, unspecified: Secondary | ICD-10-CM | POA: Diagnosis not present

## 2022-03-31 DIAGNOSIS — E559 Vitamin D deficiency, unspecified: Secondary | ICD-10-CM | POA: Diagnosis not present

## 2022-03-31 DIAGNOSIS — E781 Pure hyperglyceridemia: Secondary | ICD-10-CM | POA: Diagnosis not present

## 2022-05-20 DIAGNOSIS — E039 Hypothyroidism, unspecified: Secondary | ICD-10-CM | POA: Diagnosis not present

## 2022-05-20 DIAGNOSIS — R590 Localized enlarged lymph nodes: Secondary | ICD-10-CM | POA: Diagnosis not present

## 2022-05-28 DIAGNOSIS — E039 Hypothyroidism, unspecified: Secondary | ICD-10-CM | POA: Diagnosis not present

## 2022-05-28 DIAGNOSIS — J309 Allergic rhinitis, unspecified: Secondary | ICD-10-CM | POA: Diagnosis not present

## 2022-05-28 DIAGNOSIS — R030 Elevated blood-pressure reading, without diagnosis of hypertension: Secondary | ICD-10-CM | POA: Diagnosis not present

## 2022-05-28 DIAGNOSIS — Z23 Encounter for immunization: Secondary | ICD-10-CM | POA: Diagnosis not present

## 2022-06-18 NOTE — Progress Notes (Incomplete)
    06/18/2022 Todd Obrien [id number] 1981-05-03  Referring provider: Fanny Bien, MD Primary GI doctor: Dr. Silverio Decamp  ASSESSMENT AND PLAN:   There are no diagnoses linked to this encounter.   History of Present Illness:  41 y.o. male  with a past medical history of *** and others listed below, returns to clinic today for evaluation of ***. ***  He  reports that he has never smoked. He has never used smokeless tobacco. He reports current alcohol use of about 12.0 standard drinks of alcohol per week. He reports that he does not use drugs. His family history includes Healthy in his brother and mother; Other in his father.   Current Medications:   Current Outpatient Medications (Endocrine & Metabolic):    levothyroxine (SYNTHROID, LEVOTHROID) 175 MCG tablet, Take 175 mcg by mouth daily.    Current Outpatient Medications (Analgesics):    ibuprofen (ADVIL,MOTRIN) 400 MG tablet, Take 400 mg by mouth every 6 (six) hours as needed.   Current Outpatient Medications (Other):    MULTIPLE VITAMIN PO, Take 1 tablet by mouth daily.  Surgical History:  He  has a past surgical history that includes Foot surgery (2013).  Current Medications, Allergies, Past Medical History, Past Surgical History, Family History and Social History were reviewed in [employer] record.  Physical Exam: There were no vitals taken for this visit. General:   Pleasant, well developed male in no acute distress Heart : Regular rate and rhythm; no murmurs Pulm: Clear anteriorly; no wheezing Abdomen:  {BlankSingle:19197::"Distended","Ridged","Soft"}, {BlankSingle:19197::"Flat","Obese","Non-distended"} AB, {BlankSingle:19197::"Absent","Hyperactive, tinkling","Hypoactive","Sluggish","Active"} bowel sounds. {actendernessAB:27319} tenderness {anatomy; site abdomen:5010}. {BlankMultiple:19196::"Without guarding","With guarding","Without rebound","With rebound"}, No organomegaly  appreciated. Rectal: {acrectalexam:27461} Extremities:  {With/without:5700}  edema. Neurologic:  Alert and  oriented x4;  No focal deficits.  Psych:  Cooperative. Normal mood and affect.   Vladimir Crofts, PA-C 06/18/22

## 2022-06-21 ENCOUNTER — Ambulatory Visit: Payer: BC Managed Care – PPO | Admitting: Physician Assistant

## 2022-06-28 DIAGNOSIS — Z125 Encounter for screening for malignant neoplasm of prostate: Secondary | ICD-10-CM | POA: Diagnosis not present

## 2022-06-28 DIAGNOSIS — Z13220 Encounter for screening for lipoid disorders: Secondary | ICD-10-CM | POA: Diagnosis not present

## 2022-06-28 DIAGNOSIS — Z114 Encounter for screening for human immunodeficiency virus [HIV]: Secondary | ICD-10-CM | POA: Diagnosis not present

## 2022-06-28 DIAGNOSIS — Z0000 Encounter for general adult medical examination without abnormal findings: Secondary | ICD-10-CM | POA: Diagnosis not present

## 2022-06-28 DIAGNOSIS — I10 Essential (primary) hypertension: Secondary | ICD-10-CM | POA: Diagnosis not present

## 2022-07-01 DIAGNOSIS — E039 Hypothyroidism, unspecified: Secondary | ICD-10-CM | POA: Diagnosis not present

## 2022-07-01 DIAGNOSIS — R7989 Other specified abnormal findings of blood chemistry: Secondary | ICD-10-CM | POA: Diagnosis not present

## 2022-07-01 DIAGNOSIS — E782 Mixed hyperlipidemia: Secondary | ICD-10-CM | POA: Diagnosis not present

## 2022-07-01 DIAGNOSIS — K760 Fatty (change of) liver, not elsewhere classified: Secondary | ICD-10-CM | POA: Diagnosis not present

## 2022-07-29 NOTE — Progress Notes (Signed)
07/30/2022 Todd Obrien [id number] 1981-01-25  Referring provider: Fanny Bien, MD Primary GI doctor: Dr. Silverio Decamp  ASSESSMENT AND PLAN:   MASH Some metabolic dysfunction with trigs 2021 [place] elastography unremarkable LFTs are down from 2 years ago, normal platelets FIB4 is 1 so unlikely to have fibrosis Long discussion with patient about weight loss, ETOH cessation.  Denies referral to weight loss clinic, given information.  Follow up 6 months for LFTS and weight check, consider elastography  Severe obesity (BMI >= 40) (HCC) Body mass index is 35.15 kg/m.  -Patient has been advised to make an attempt to improve diet and exercise patterns to aid in weight loss. -Recommended diet heavy in fruits and veggies and low in animal meats, cheeses, and dairy products, appropriate calorie intake  Screen for colon cancer Due age 56 year old   Patient Care Team: Fanny Bien, MD as PCP - General (Family Medicine)  HISTORY OF PRESENT ILLNESS: 41 y.o. male with a past medical history of hypothyroidism, fatty liver and others listed below presents for evaluation LFTs.   06/2020 OV history of hepatic steatosis seen on abdominal ultrasound.  Negative ANA, AMA.  Ferritin negative, alpha 1 unremarkable, ceruloplasmin negative.  Negative hepatitis panel. 07/2020 ultrasound elastography kPA less than 5 probably normal  He body mass index is 35.15 kg/m..  Wt Readings from Last 8 Encounters:  07/30/22 252 lb (114.3 kg)  06/30/20 238 lb 6.4 oz (108.1 kg)       Latest Ref Rng & Units 06/30/2020    2:32 PM  Hepatic Function  Total Protein 6.0 - 8.3 g/dL 6.6   Albumin 3.5 - 5.2 g/dL 3.6   AST 0 - 37 U/L 70   ALT 0 - 53 U/L 122   Alk Phosphatase 39 - 117 U/L 60   Total Bilirubin 0.2 - 1.2 mg/dL 0.9   Bilirubin, Direct 0.0 - 0.3 mg/dL 0.1     He reports ETOH use, 6-8 on the weekend, nothing during the week or very rare.  Patient is not a smoker.  Denies Tylenol use,  rare NSAID use for headache, and Denies OTC supplement use.  He is not diabetic.  He has gained weight from 2 years ago.  FIB 4  calculated from labs on 11/07 is 1.0, no advanced fibrosis, Labs review from PCP show platelets 179, Trigs 165. Last ALT 80, AST 41.   The patient denies issues with jaundice, scleral icterus, dark urine, clay colored stool.  He denies Abdominal distention, LE edema. Denies generalized pruritus. Denies  family history of liver disease.  Has throat clearing from allergies. No GERD, no nausea, not vomiting.  No abnormal stools, no melena, no hematochezia.   He  reports that he has never smoked. He has never used smokeless tobacco. He reports current alcohol use of about 12.0 standard drinks of alcohol per week. He reports that he does not use drugs.  Current Medications:   Current Outpatient Medications (Endocrine & Metabolic):    levothyroxine (SYNTHROID, LEVOTHROID) 175 MCG tablet, Take 175 mcg by mouth daily.    Current Outpatient Medications (Analgesics):    ibuprofen (ADVIL,MOTRIN) 400 MG tablet, Take 400 mg by mouth every 6 (six) hours as needed.   Current Outpatient Medications (Other):    MULTIPLE VITAMIN PO, Take 1 tablet by mouth daily.  Medical History:  Past Medical History:  Diagnosis Date   High blood pressure    Hypothyroid    Allergies: No Known Allergies  Surgical History:  He  has a past surgical history that includes Foot surgery (2013). Family History:  His family history includes Healthy in his brother and mother; Other in his father.  REVIEW OF SYSTEMS  : All other systems reviewed and negative except where noted in the History of Present Illness.  PHYSICAL EXAM: BP (!) 130/90   Pulse 79   Ht _0  (1.803 m)   Wt 252 lb (114.3 kg)   SpO2 97%   BMI 35.15 kg/m  General:   Pleasant, well developed male in no acute distress Head:   Normocephalic and atraumatic. Eyes:  sclerae anicteric,conjunctive pink  Heart:    regular rate and rhythm Pulm:  Clear anteriorly; no wheezing Abdomen:   Soft, Obese AB, Active bowel sounds. No tenderness, No organomegaly appreciated. Rectal: Not evaluated Extremities:  Without edema. Msk: Symmetrical without gross deformities. Peripheral pulses intact.  Neurologic:  Alert and  oriented x4;  No focal deficits.  Skin:   Dry and intact without significant lesions or rashes. Psychiatric:  Cooperative. Normal mood and affect.  RELEVANT LABS AND IMAGING: CBC    Component Value Date/Time   HGB 16.9 04/23/2011 0655    CMP     Component Value Date/Time   PROT 6.6 06/30/2020 1432   ALBUMIN 3.6 06/30/2020 1432   AST 70 (H) 06/30/2020 1432   ALT 122 (H) 06/30/2020 1432   ALKPHOS 60 06/30/2020 1432   BILITOT 0.9 06/30/2020 1432     Vladimir Crofts, PA-C 10:54 AM

## 2022-07-30 ENCOUNTER — Ambulatory Visit: Payer: BC Managed Care – PPO | Admitting: Physician Assistant

## 2022-07-30 ENCOUNTER — Encounter: Payer: Self-pay | Admitting: Physician Assistant

## 2022-07-30 VITALS — BP 130/90 | HR 79 | Ht 71.0 in | Wt 252.0 lb

## 2022-07-30 DIAGNOSIS — R7989 Other specified abnormal findings of blood chemistry: Secondary | ICD-10-CM | POA: Diagnosis not present

## 2022-07-30 DIAGNOSIS — E063 Autoimmune thyroiditis: Secondary | ICD-10-CM | POA: Diagnosis not present

## 2022-07-30 DIAGNOSIS — I10 Essential (primary) hypertension: Secondary | ICD-10-CM | POA: Diagnosis not present

## 2022-07-30 DIAGNOSIS — K760 Fatty (change of) liver, not elsewhere classified: Secondary | ICD-10-CM | POA: Diagnosis not present

## 2022-07-30 DIAGNOSIS — Z1211 Encounter for screening for malignant neoplasm of colon: Secondary | ICD-10-CM

## 2022-07-30 DIAGNOSIS — E559 Vitamin D deficiency, unspecified: Secondary | ICD-10-CM | POA: Diagnosis not present

## 2022-07-30 DIAGNOSIS — Z0000 Encounter for general adult medical examination without abnormal findings: Secondary | ICD-10-CM | POA: Diagnosis not present

## 2022-07-30 DIAGNOSIS — R945 Abnormal results of liver function studies: Secondary | ICD-10-CM | POA: Diagnosis not present

## 2022-07-30 NOTE — Patient Instructions (Addendum)
Metabolic dysfunction associated seatohepatitis  Now the leading cause of liver failure in the [place].  It is normally from such risk factors as obesity, diabetes, insulin resistance, high cholesterol, or metabolic syndrome.  The only definitive therapy is weight loss and exercise.   Suggest walking 20-30 mins daily.  Decreasing carbohydrates, increasing veggies.    Fatty Liver Fatty liver is the accumulation of fat in liver cells. It is also called hepatosteatosis or steatohepatitis. It is normal for your liver to contain some fat. If fat is more than 5 to 10% of your liver's weight, you have fatty liver.  There are often no symptoms (problems) for years while damage is still occurring. People often learn about their fatty liver when they have medical tests for other reasons. Fat can damage your liver for years or even decades without causing problems. When it becomes severe, it can cause fatigue, weight loss, weakness, and confusion. This makes you more likely to develop more serious liver problems. The liver is the largest organ in the body. It does a lot of work and often gives no warning signs when it is sick until late in a disease. The liver has many important jobs including: Breaking down foods. Storing vitamins, iron, and other minerals. Making proteins. Making bile for food digestion. Breaking down many products including medications, alcohol and some poisons.  PROGNOSIS  Fatty liver may cause no damage or it can lead to an inflammation of the liver. This is, called steatohepatitis.  Over time the liver may become scarred and hardened. This condition is called cirrhosis. Cirrhosis is serious and may lead to liver failure or cancer. NASH is one of the leading causes of cirrhosis. About 10-20% of Americans have fatty liver and a smaller 2-5% has NASH.  TREATMENT  Weight loss, fat restriction, and exercise in overweight patients produces inconsistent results but is worth  trying. Good control of diabetes may reduce fatty liver. Eat a balanced, healthy diet. Increase your physical activity. There are no medical or surgical treatments for a fatty liver or NASH, but improving your diet and increasing your exercise may help prevent or reverse some of the damage.  General weight loss tips   Drink 1/2 your body weight in fluid ounces of water daily; drink a tall glass of water 30 min before meals Before eating, ask yourself if you are hungry, bored, stressed or even just thirsty  Always eat at the table; avoid eating while distracted Don't eat until you're stuffed- eat slowly, listen to your stomach and eat until you are 80% full  Aim for each meal or snack to have fiber + protein + healthy fats = satisfying  Try eating off of a salad plate; wait 10 min after finishing before going back for seconds Start by eating the vegetables on your plate; aim for 51% of your meals to be fruits and vegetables Then eat your protein - lean meats (grass fed if possible), fish, beans, nuts/seeds in moderation Eat your carbs/starch last ONLY if you still are hungry, and choose high fiber options as much as possible (brown rice, oats, quinoa, farro, etc). If you get full before finishing, don't feel bad about leaving some on your plate Avoid sugar, flour, processed - the closer it looks to it's original form in nature, typically the better it is for you, and will avoid triggering a glucose spike that will store your calories as fat and make you more hungry Splurge in moderation if needed - "assign" meals  when you get to splurge and have the "bad stuff" - I like to follow a 80% - 20% plan- "healthy" choices 80 % of the time, "splurge" choices in moderation 20% of the time Simple equation is: Calories out > calories in = weight loss - even if you eat the bad stuff, if you limit portions, you will still lose weight, just not as fast Make sustainable changes - this is a journey to build  lifelong healthy habits! A healthy change should help you feel energized, focused and light on your feet. If you make a change and you feel miserable doing it, please ask for help :)

## 2022-08-25 DIAGNOSIS — K760 Fatty (change of) liver, not elsewhere classified: Secondary | ICD-10-CM | POA: Diagnosis not present

## 2022-08-25 DIAGNOSIS — I10 Essential (primary) hypertension: Secondary | ICD-10-CM | POA: Diagnosis not present

## 2022-08-25 DIAGNOSIS — E039 Hypothyroidism, unspecified: Secondary | ICD-10-CM | POA: Diagnosis not present

## 2022-10-25 ENCOUNTER — Encounter: Payer: Self-pay | Admitting: Physician Assistant

## 2022-12-10 DIAGNOSIS — E039 Hypothyroidism, unspecified: Secondary | ICD-10-CM | POA: Diagnosis not present

## 2022-12-10 DIAGNOSIS — E782 Mixed hyperlipidemia: Secondary | ICD-10-CM | POA: Diagnosis not present

## 2022-12-10 DIAGNOSIS — I10 Essential (primary) hypertension: Secondary | ICD-10-CM | POA: Diagnosis not present

## 2022-12-17 DIAGNOSIS — R748 Abnormal levels of other serum enzymes: Secondary | ICD-10-CM | POA: Diagnosis not present

## 2022-12-17 DIAGNOSIS — I10 Essential (primary) hypertension: Secondary | ICD-10-CM | POA: Diagnosis not present

## 2022-12-17 DIAGNOSIS — E039 Hypothyroidism, unspecified: Secondary | ICD-10-CM | POA: Diagnosis not present

## 2023-01-21 DIAGNOSIS — I10 Essential (primary) hypertension: Secondary | ICD-10-CM | POA: Diagnosis not present

## 2023-01-21 DIAGNOSIS — R0683 Snoring: Secondary | ICD-10-CM | POA: Diagnosis not present

## 2023-01-21 DIAGNOSIS — R945 Abnormal results of liver function studies: Secondary | ICD-10-CM | POA: Diagnosis not present

## 2023-01-25 ENCOUNTER — Telehealth: Payer: Self-pay

## 2023-01-25 NOTE — Telephone Encounter (Signed)
Received a referral from Dr Dewey to r/o OSA. Placed in sleep lab mailbox  

## 2023-03-10 ENCOUNTER — Encounter: Payer: Self-pay | Admitting: Physician Assistant

## 2023-03-10 ENCOUNTER — Institutional Professional Consult (permissible substitution): Payer: Self-pay | Admitting: Neurology

## 2023-03-25 ENCOUNTER — Encounter: Payer: Self-pay | Admitting: Internal Medicine

## 2023-03-25 ENCOUNTER — Ambulatory Visit: Payer: BC Managed Care – PPO | Admitting: Internal Medicine

## 2023-03-25 VITALS — BP 130/70 | HR 84 | Ht 71.0 in | Wt 251.0 lb

## 2023-03-25 DIAGNOSIS — E063 Autoimmune thyroiditis: Secondary | ICD-10-CM | POA: Diagnosis not present

## 2023-03-25 DIAGNOSIS — R0683 Snoring: Secondary | ICD-10-CM | POA: Diagnosis not present

## 2023-03-25 LAB — T4, FREE: Free T4: 1.03 ng/dL (ref 0.60–1.60)

## 2023-03-25 LAB — TSH: TSH: 0.27 u[IU]/mL — ABNORMAL LOW (ref 0.35–5.50)

## 2023-03-25 MED ORDER — LEVOTHYROXINE SODIUM 150 MCG PO TABS: 150.0000 ug | ORAL_TABLET | Freq: Every day | ORAL | 3 refills | Status: DC

## 2023-03-25 NOTE — Patient Instructions (Signed)

## 2023-03-25 NOTE — Progress Notes (Signed)
Name: Todd Obrien  MRN/ DOB: [id number], 1981/07/31    Age/ Sex: 42 y.o., male    PCP: Lewis Moccasin, MD   Reason for Endocrinology Evaluation: Hashimoto's thyroiditis     Date of Initial Endocrinology Evaluation: 03/25/2023     HPI: [name] is a 42 y.o. male with a past medical history of Hashimoto's thyroiditis, dyslipidemia. The patient presented for initial endocrinology clinic visit on 03/25/2023 for consultative assistance with his Hypothyroidism.   Patient has been diagnosed with hypothyroid > 10 yrs ago  No prior XRT exposure No biotin intake Patient on MVI and vitamin D 5000 IU daily   He was noted to have elevated anti-TPO antibodies of 55.7 and thyroglobulin antibodies >2500 in 04/2022  Thyroid ultrasound shows heterogeneous gland but no nodules  Weight has been stable Denies palpitations Denies constipation and diarrhea No tremors No local neck symptoms Patient does feel tired mid afternoon, he does snore  Mother with thyroid disease  Levothyroxine 175 mcg daily  HISTORY:  Past Medical History:  Past Medical History:  Diagnosis Date   High blood pressure    Hypothyroid    Past Surgical History:  Past Surgical History:  Procedure Laterality Date   FOOT SURGERY  2013    Social History:  reports that he has never smoked. He has never used smokeless tobacco. He reports current alcohol use of about 12.0 standard drinks of alcohol per week. He reports that he does not use drugs. Family History: family history includes Healthy in his brother and mother; Other in his father.   HOME MEDICATIONS: Allergies as of 03/25/2023   No Known Allergies      Medication List        Accurate as of March 25, 2023  8:42 AM. If you have any questions, ask your nurse or doctor.          ibuprofen 400 MG tablet Commonly known as: ADVIL Take 400 mg by mouth every 6 (six) hours as needed.   levothyroxine 175 MCG tablet Commonly known as:  SYNTHROID Take 175 mcg by mouth daily.   lisinopril 40 MG tablet Commonly known as: ZESTRIL Take 40 mg by mouth daily.   MULTIPLE VITAMIN PO Take 1 tablet by mouth daily.          REVIEW OF SYSTEMS: A comprehensive ROS was conducted with the patient and is negative except as per HPI and below:  ROS     OBJECTIVE:  VS: BP 130/70 (BP Location: Left Arm, Patient Position: Sitting, Cuff Size: Large)   Pulse 84   Ht 5\' 11"  (1.803 m)   Wt 251 lb (113.9 kg)   SpO2 99%   BMI 35.01 kg/m    Wt Readings from Last 3 Encounters:  03/25/23 251 lb (113.9 kg)  07/30/22 252 lb (114.3 kg)  06/30/20 238 lb 6.4 oz (108.1 kg)     EXAM: General: Pt appears well and is in NAD  Eyes: External eye exam normal without stare, lid lag or exophthalmos.  EOM intact.  PERRL.  Neck: General: Supple without adenopathy. Thyroid: Thyroid size normal.  No goiter or nodules appreciated. No thyroid bruit.  Lungs: Clear with good BS bilat   Heart: Auscultation: RRR.  Abdomen: Soft, nontender  Extremities:  BL LE: No pretibial edema   Mental Status: Judgment, insight: Intact Orientation: Oriented to time, place, and person Mood and affect: No depression, anxiety, or agitation     DATA REVIEWED:  Latest Reference Range & Units 03/25/23 09:11  TSH 0.35 - 5.50 uIU/mL 0.27 (L)  T4,Free(Direct) 0.60 - 1.60 ng/dL 4.78  (L): Data is abnormally low   Old records , labs and images have been reviewed.   ASSESSMENT/PLAN/RECOMMENDATIONS:   Hashimoto's thyroiditis  - Pt is clinically euthyroid  - - Pt educated extensively on the correct way to take levothyroxine (first thing in the morning with water, 30 minutes before eating or taking other medications). - Pt encouraged to double dose the following day if she were to miss a dose given long half-life of levothyroxine. -I explained to the patient that Hashimoto's Disease is an autoimmune - mediated destruction of the thyroid gland. The usual  course of Hashimoto's thyroiditis is the gradual loss of thyroid function.  -TSH is low, will decrease levothyroxine dose as below   Medications : Stop levothyroxine 175 mcg daily  Start levothyroxine 150 mcg daily    2. Snoring:   - Pt is at high risk for OSA  - I have recommended further evaluation of this   Signed electronically by: Lyndle Herrlich, MD  [facility]  [facility] [address] [place] [place], [place] [address] Phone: [phone] FAX: [phone]   CC: Lewis Moccasin, MD [address] [place] [address] Phone: [phone] Fax: [phone]   Return to Endocrinology clinic as below: Future Appointments  Date Time Provider Department Center  04/14/2023  2:15 PM Huston Foley, MD [facility] None

## 2023-04-14 ENCOUNTER — Institutional Professional Consult (permissible substitution): Payer: Self-pay | Admitting: Neurology

## 2023-04-15 DIAGNOSIS — K760 Fatty (change of) liver, not elsewhere classified: Secondary | ICD-10-CM | POA: Diagnosis not present

## 2023-04-15 DIAGNOSIS — E782 Mixed hyperlipidemia: Secondary | ICD-10-CM | POA: Diagnosis not present

## 2023-04-15 DIAGNOSIS — I10 Essential (primary) hypertension: Secondary | ICD-10-CM | POA: Diagnosis not present

## 2023-04-22 DIAGNOSIS — Z789 Other specified health status: Secondary | ICD-10-CM | POA: Diagnosis not present

## 2023-04-22 DIAGNOSIS — K760 Fatty (change of) liver, not elsewhere classified: Secondary | ICD-10-CM | POA: Diagnosis not present

## 2023-04-22 DIAGNOSIS — I10 Essential (primary) hypertension: Secondary | ICD-10-CM | POA: Diagnosis not present

## 2023-06-01 DIAGNOSIS — R0681 Apnea, not elsewhere classified: Secondary | ICD-10-CM | POA: Diagnosis not present

## 2023-06-01 DIAGNOSIS — J309 Allergic rhinitis, unspecified: Secondary | ICD-10-CM | POA: Diagnosis not present

## 2023-06-01 DIAGNOSIS — I10 Essential (primary) hypertension: Secondary | ICD-10-CM | POA: Diagnosis not present

## 2023-06-01 DIAGNOSIS — R400 Somnolence: Secondary | ICD-10-CM | POA: Diagnosis not present

## 2023-07-05 ENCOUNTER — Telehealth: Payer: Self-pay | Admitting: Neurology

## 2023-07-05 NOTE — Telephone Encounter (Signed)
Pt cancelled appointment due to PCP has already done home sleep study.

## 2023-07-06 ENCOUNTER — Institutional Professional Consult (permissible substitution): Payer: Self-pay | Admitting: Neurology

## 2023-07-27 DIAGNOSIS — I10 Essential (primary) hypertension: Secondary | ICD-10-CM | POA: Diagnosis not present

## 2023-07-27 DIAGNOSIS — E785 Hyperlipidemia, unspecified: Secondary | ICD-10-CM | POA: Diagnosis not present

## 2023-09-27 NOTE — Progress Notes (Signed)
 Name: Todd Obrien  MRN/ DOB: [id number], 1981/02/17    Age/ Sex: 43 y.o., male    PCP: Waylan Almarie SAUNDERS, MD   Reason for Endocrinology Evaluation: Hashimoto's thyroiditis     Date of Initial Endocrinology Evaluation: 03/25/2023    HPI: [name] is a 42 y.o. male with a past medical history of Hashimoto's thyroiditis, dyslipidemia. The patient presented for initial endocrinology clinic visit on 03/25/2023  for consultative assistance with his Hypothyroidism.   Patient has been diagnosed with hypothyroid > 10 yrs ago  No prior XRT exposure No biotin intake  He was noted to have elevated anti-TPO antibodies of 55.7 and thyroglobulin antibodies >2500 in 04/2022  Thyroid  ultrasound shows heterogeneous gland but no nodules   Mother with thyroid  disease    SUBJECTIVE:    Today (09/28/23):  Todd Obrien is here for a follow on hashimoto's thyroiditis.  Pt has been noted with weight decrease Denies changes in energy levels  Denies palpitations Denies constipation and diarrhea No tremors No local neck symptoms  Continues with MVI and Vitamin   Levothyroxine  150 mcg daily  HISTORY:  Past Medical History:  Past Medical History:  Diagnosis Date   High blood pressure    Hypothyroid    Past Surgical History:  Past Surgical History:  Procedure Laterality Date   FOOT SURGERY  2013    Social History:  reports that he has never smoked. He has never used smokeless tobacco. He reports current alcohol use of about 12.0 standard drinks of alcohol per week. He reports that he does not use drugs. Family History: family history includes Healthy in his brother and mother; Other in his father.   HOME MEDICATIONS: Allergies as of 09/28/2023   No Known Allergies      Medication List        Accurate as of September 28, 2023  8:39 AM. If you have any questions, ask your nurse or doctor.          gemfibrozil 600 MG tablet Commonly known as: LOPID Take 600 mg by  mouth 2 (two) times daily.   ibuprofen 400 MG tablet Commonly known as: ADVIL Take 400 mg by mouth every 6 (six) hours as needed.   ipratropium 0.06 % nasal spray Commonly known as: ATROVENT Place into both nostrils.   levothyroxine  150 MCG tablet Commonly known as: SYNTHROID  Take 1 tablet (150 mcg total) by mouth daily.   lisinopril 40 MG tablet Commonly known as: ZESTRIL Take 40 mg by mouth daily.   MULTIPLE VITAMIN PO Take 1 tablet by mouth daily.          REVIEW OF SYSTEMS: A comprehensive ROS was conducted with the patient and is negative except as per HPI     OBJECTIVE:  VS: BP 128/82 (BP Location: Left Arm, Patient Position: Sitting, Cuff Size: Normal)   Pulse 72   Ht 5' 11 (1.803 m)   Wt 243 lb (110.2 kg)   SpO2 95%   BMI 33.89 kg/m    Wt Readings from Last 3 Encounters:  09/28/23 243 lb (110.2 kg)  03/25/23 251 lb (113.9 kg)  07/30/22 252 lb (114.3 kg)     EXAM: General: Pt appears well and is in NAD  Neck: General: Supple without adenopathy. Thyroid : Thyroid  size normal.  No goiter or nodules appreciated  Lungs: Clear with good BS bilat   Heart: Auscultation: RRR.  Abdomen: Soft, nontender  Extremities:  BL LE: No pretibial edema   Mental  Status: Judgment, insight: Intact Orientation: Oriented to time, place, and person Mood and affect: No depression, anxiety, or agitation     DATA REVIEWED:  Latest Reference Range & Units 09/28/23 09:12  Vitamin D , 25-Hydroxy 30 - 100 ng/mL 24 (L)  TSH 0.40 - 4.50 mIU/L 0.19 (L)  T4,Free(Direct) 0.8 - 1.8 ng/dL 1.2  (L): Data is abnormally low    Old records , labs and images have been reviewed.   ASSESSMENT/PLAN/RECOMMENDATIONS:   Hashimoto's thyroiditis  - Pt is clinically euthyroid  - - Pt educated extensively on the correct way to take levothyroxine  (first thing in the morning with water, 30 minutes before eating or taking other medications). - Pt encouraged to double dose the following  day if she were to miss a dose given long half-life of levothyroxine . - TSH continues to be low  -Will recheck TFTs in 2 months  Medications : Stop Levothyroxine  150 mcg daily Start Levothyroxine  137 mcg daily   2. Vitamin D  Insufficiency:  - Will increase Vitamin D     F/U in 6 months   Signed electronically by: Stefano Redgie Butts, MD  [facility]  [facility] [address] [place] [place], [place] [address] Phone: [phone] FAX: [phone]   CC: Waylan Almarie SAUNDERS, MD [address] [place] [address] Phone: [phone] Fax: [phone]   Return to Endocrinology clinic as below: No future appointments.

## 2023-09-28 ENCOUNTER — Ambulatory Visit: Payer: BC Managed Care – PPO | Admitting: Internal Medicine

## 2023-09-28 ENCOUNTER — Encounter: Payer: Self-pay | Admitting: Internal Medicine

## 2023-09-28 VITALS — BP 128/82 | HR 72 | Ht 71.0 in | Wt 243.0 lb

## 2023-09-28 DIAGNOSIS — E063 Autoimmune thyroiditis: Secondary | ICD-10-CM | POA: Diagnosis not present

## 2023-09-28 DIAGNOSIS — E559 Vitamin D deficiency, unspecified: Secondary | ICD-10-CM | POA: Diagnosis not present

## 2023-09-28 NOTE — Patient Instructions (Signed)

## 2023-09-29 ENCOUNTER — Telehealth: Payer: Self-pay | Admitting: Internal Medicine

## 2023-09-29 ENCOUNTER — Encounter: Payer: Self-pay | Admitting: Internal Medicine

## 2023-09-29 LAB — VITAMIN D 25 HYDROXY (VIT D DEFICIENCY, FRACTURES): Vit D, 25-Hydroxy: 24 ng/mL — ABNORMAL LOW (ref 30–100)

## 2023-09-29 LAB — T4, FREE: Free T4: 1.2 ng/dL (ref 0.8–1.8)

## 2023-09-29 LAB — TSH: TSH: 0.19 m[IU]/L — ABNORMAL LOW (ref 0.40–4.50)

## 2023-09-29 MED ORDER — LEVOTHYROXINE SODIUM 137 MCG PO TABS: 137.0000 ug | ORAL_TABLET | Freq: Every day | ORAL | 3 refills | Status: DC

## 2023-09-29 NOTE — Telephone Encounter (Signed)
 Please contact the patient and schedule him for repeat labs in 2 months   A portal message was sent to him, thanks

## 2023-10-14 DIAGNOSIS — Z0000 Encounter for general adult medical examination without abnormal findings: Secondary | ICD-10-CM | POA: Diagnosis not present

## 2023-10-14 DIAGNOSIS — Z13220 Encounter for screening for lipoid disorders: Secondary | ICD-10-CM | POA: Diagnosis not present

## 2023-10-14 DIAGNOSIS — Z125 Encounter for screening for malignant neoplasm of prostate: Secondary | ICD-10-CM | POA: Diagnosis not present

## 2023-10-21 DIAGNOSIS — Z0000 Encounter for general adult medical examination without abnormal findings: Secondary | ICD-10-CM | POA: Diagnosis not present

## 2023-10-24 DIAGNOSIS — Z789 Other specified health status: Secondary | ICD-10-CM | POA: Diagnosis not present

## 2023-10-24 DIAGNOSIS — E039 Hypothyroidism, unspecified: Secondary | ICD-10-CM | POA: Diagnosis not present

## 2023-10-24 DIAGNOSIS — I10 Essential (primary) hypertension: Secondary | ICD-10-CM | POA: Diagnosis not present

## 2023-10-24 DIAGNOSIS — E782 Mixed hyperlipidemia: Secondary | ICD-10-CM | POA: Diagnosis not present

## 2023-12-15 DIAGNOSIS — Z713 Dietary counseling and surveillance: Secondary | ICD-10-CM | POA: Diagnosis not present

## 2024-01-06 DIAGNOSIS — R739 Hyperglycemia, unspecified: Secondary | ICD-10-CM | POA: Diagnosis not present

## 2024-01-06 DIAGNOSIS — K760 Fatty (change of) liver, not elsewhere classified: Secondary | ICD-10-CM | POA: Diagnosis not present

## 2024-01-06 DIAGNOSIS — E063 Autoimmune thyroiditis: Secondary | ICD-10-CM | POA: Diagnosis not present

## 2024-01-06 DIAGNOSIS — R7989 Other specified abnormal findings of blood chemistry: Secondary | ICD-10-CM | POA: Diagnosis not present

## 2024-01-06 DIAGNOSIS — E559 Vitamin D deficiency, unspecified: Secondary | ICD-10-CM | POA: Diagnosis not present

## 2024-01-06 DIAGNOSIS — E781 Pure hyperglyceridemia: Secondary | ICD-10-CM | POA: Diagnosis not present

## 2024-01-13 ENCOUNTER — Other Ambulatory Visit: Payer: Self-pay | Admitting: Nurse Practitioner

## 2024-01-13 DIAGNOSIS — E781 Pure hyperglyceridemia: Secondary | ICD-10-CM | POA: Diagnosis not present

## 2024-01-13 DIAGNOSIS — K760 Fatty (change of) liver, not elsewhere classified: Secondary | ICD-10-CM

## 2024-01-13 DIAGNOSIS — I10 Essential (primary) hypertension: Secondary | ICD-10-CM | POA: Diagnosis not present

## 2024-01-13 DIAGNOSIS — E039 Hypothyroidism, unspecified: Secondary | ICD-10-CM | POA: Diagnosis not present

## 2024-01-13 DIAGNOSIS — E559 Vitamin D deficiency, unspecified: Secondary | ICD-10-CM | POA: Diagnosis not present

## 2024-01-13 DIAGNOSIS — R7989 Other specified abnormal findings of blood chemistry: Secondary | ICD-10-CM

## 2024-01-17 DIAGNOSIS — Z713 Dietary counseling and surveillance: Secondary | ICD-10-CM | POA: Diagnosis not present

## 2024-02-15 DIAGNOSIS — Z713 Dietary counseling and surveillance: Secondary | ICD-10-CM | POA: Diagnosis not present

## 2024-03-15 DIAGNOSIS — Z713 Dietary counseling and surveillance: Secondary | ICD-10-CM | POA: Diagnosis not present

## 2024-03-16 ENCOUNTER — Ambulatory Visit
Admission: RE | Admit: 2024-03-16 | Discharge: 2024-03-16 | Disposition: A | Discharge status: Home / Self Care | Source: Ambulatory Visit | Attending: Nurse Practitioner | Admitting: Nurse Practitioner

## 2024-03-16 DIAGNOSIS — K760 Fatty (change of) liver, not elsewhere classified: Secondary | ICD-10-CM

## 2024-03-16 DIAGNOSIS — K7689 Other specified diseases of liver: Secondary | ICD-10-CM | POA: Diagnosis not present

## 2024-03-16 DIAGNOSIS — R7989 Other specified abnormal findings of blood chemistry: Secondary | ICD-10-CM

## 2024-03-23 ENCOUNTER — Other Ambulatory Visit

## 2024-03-27 ENCOUNTER — Ambulatory Visit: Payer: BC Managed Care – PPO | Admitting: Internal Medicine

## 2024-04-03 ENCOUNTER — Ambulatory Visit: Payer: BC Managed Care – PPO | Admitting: Internal Medicine

## 2024-04-03 ENCOUNTER — Encounter: Payer: Self-pay | Admitting: Internal Medicine

## 2024-04-03 VITALS — BP 134/76 | HR 74 | Ht 71.0 in | Wt 234.8 lb

## 2024-04-03 DIAGNOSIS — E063 Autoimmune thyroiditis: Secondary | ICD-10-CM | POA: Diagnosis not present

## 2024-04-03 DIAGNOSIS — E559 Vitamin D deficiency, unspecified: Secondary | ICD-10-CM

## 2024-04-03 LAB — TSH: TSH: 43.51 m[IU]/L — ABNORMAL HIGH (ref 0.40–4.50)

## 2024-04-03 LAB — T4, FREE: Free T4: 0.9 ng/dL (ref 0.8–1.8)

## 2024-04-03 NOTE — Progress Notes (Signed)
 Name: Todd Obrien  MRN/ DOB: [id number], 1981/06/26    Age/ Sex: 43 y.o., male    PCP: Waylan Almarie SAUNDERS, MD   Reason for Endocrinology Evaluation: Hashimoto's thyroiditis     Date of Initial Endocrinology Evaluation: 03/25/2023    HPI: [name] is a 43 y.o. male with a past medical history of Hashimoto's thyroiditis, dyslipidemia. The patient presented for initial endocrinology clinic visit on 03/25/2023  for consultative assistance with his Hypothyroidism.   Patient has been diagnosed with hypothyroid > 10 yrs ago  No prior XRT exposure No biotin intake  He was noted to have elevated anti-TPO antibodies of 55.7 and thyroglobulin antibodies >2500 in 04/2022  Thyroid ultrasound shows heterogeneous gland but no nodules   Mother with thyroid disease    SUBJECTIVE:    Today (04/03/24):  Todd Obrien is here for a follow on hashimoto's thyroiditis.  Patient continues with weight loss No local neck swelling  Denies palpitations Denies constipation and diarrhea No tremors Denies depression    Levothyroxine 137 mcg daily  HISTORY:  Past Medical History:  Past Medical History:  Diagnosis Date   High blood pressure    Hypothyroid    Past Surgical History:  Past Surgical History:  Procedure Laterality Date   FOOT SURGERY  2013    Social History:  reports that he has never smoked. He has never used smokeless tobacco. He reports current alcohol use of about 12.0 standard drinks of alcohol per week. He reports that he does not use drugs. Family History: family history includes Healthy in his brother and mother; Other in his father.   HOME MEDICATIONS: Allergies as of 04/03/2024   No Known Allergies      Medication List        Accurate as of April 03, 2024  8:50 AM. If you have any questions, ask your nurse or doctor.          gemfibrozil 600 MG tablet Commonly known as: LOPID Take 600 mg by mouth 2 (two) times daily.   ibuprofen 400 MG  tablet Commonly known as: ADVIL Take 400 mg by mouth every 6 (six) hours as needed.   ipratropium 0.06 % nasal spray Commonly known as: ATROVENT Place into both nostrils.   levothyroxine 137 MCG tablet Commonly known as: SYNTHROID Take 1 tablet (137 mcg total) by mouth daily before breakfast.   lisinopril 40 MG tablet Commonly known as: ZESTRIL Take 40 mg by mouth daily.   MULTIPLE VITAMIN PO Take 1 tablet by mouth daily.          REVIEW OF SYSTEMS: A comprehensive ROS was conducted with the patient and is negative except as per HPI     OBJECTIVE:  VS: BP 134/76 (BP Location: Left Arm, Patient Position: Sitting)   Pulse 74   Ht 5' 11 (1.803 m)   Wt 234 lb 12.8 oz (106.5 kg)   SpO2 95%   BMI 32.75 kg/m    Wt Readings from Last 3 Encounters:  04/03/24 234 lb 12.8 oz (106.5 kg)  09/28/23 243 lb (110.2 kg)  03/25/23 251 lb (113.9 kg)     EXAM: General: Pt appears well and is in NAD  Neck: General: Supple without adenopathy. Thyroid: Thyroid size normal.  No goiter or nodules appreciated  Lungs: Clear with good BS bilat   Heart: Auscultation: RRR.  Abdomen: Soft, nontender  Extremities:  BL LE: No pretibial edema   Mental Status: Judgment, insight: Intact Orientation: Oriented to  time, place, and person Mood and affect: No depression, anxiety, or agitation     DATA REVIEWED:  Latest Reference Range & Units 09/28/23 09:12  Vitamin D , 25-Hydroxy 30 - 100 ng/mL 24 (L)  TSH 0.40 - 4.50 mIU/L 0.19 (L)  T4,Free(Direct) 0.8 - 1.8 ng/dL 1.2     Old records , labs and images have been reviewed.   ASSESSMENT/PLAN/RECOMMENDATIONS:   Hashimoto's thyroiditis  - Pt is clinically euthyroid  - Pt educated extensively on the correct way to take levothyroxine  (first thing in the morning with water, 30 minutes before eating or taking other medications). - Pt encouraged to double dose the following day if she were to miss a dose given long half-life of  levothyroxine . - His TSH has increased from 0.19 u IU/ML to 43.51 uIU/mL by changing the dose from levothyroxine  150 mcg to 137 mcg. - I contacted the pharmacy and they verified that the last pickup was March 4,2025 @ 90 tabs of levothyroxine  150 mcg, per pharmacist that had prepared 2 refills but these were never picked up - I have contacted the patient and encourage compliance, the patient states that he takes his medications daily, I did advise the patient to obtain a pillbox because it appears that he is missing plenty of doses without being aware of it.  We discussed the risk of myxedema coma and the importance of optimizing TFT levels - Patient will return on October, ninth for repeat TFTs  Medications :   Levothyroxine  137 mcg daily      F/U in 6 months   Signed electronically by: Stefano Redgie Butts, MD  [facility]  [facility] [address] [place] [place], [place] [address] Phone: [phone] FAX: [phone]   CC: Waylan Almarie SAUNDERS, MD [address] [place] [address] Phone: [phone] Fax: [phone]   Return to Endocrinology clinic as below: No future appointments.

## 2024-04-03 NOTE — Patient Instructions (Signed)

## 2024-04-04 MED ORDER — LEVOTHYROXINE SODIUM 137 MCG PO TABS: 137.0000 ug | ORAL_TABLET | Freq: Every day | ORAL | 3 refills | Status: DC

## 2024-04-11 DIAGNOSIS — E785 Hyperlipidemia, unspecified: Secondary | ICD-10-CM | POA: Diagnosis not present

## 2024-04-11 DIAGNOSIS — D649 Anemia, unspecified: Secondary | ICD-10-CM | POA: Diagnosis not present

## 2024-04-11 DIAGNOSIS — Z713 Dietary counseling and surveillance: Secondary | ICD-10-CM | POA: Diagnosis not present

## 2024-04-16 DIAGNOSIS — Z79899 Other long term (current) drug therapy: Secondary | ICD-10-CM | POA: Diagnosis not present

## 2024-04-16 DIAGNOSIS — Z789 Other specified health status: Secondary | ICD-10-CM | POA: Diagnosis not present

## 2024-04-16 DIAGNOSIS — E782 Mixed hyperlipidemia: Secondary | ICD-10-CM | POA: Diagnosis not present

## 2024-04-16 DIAGNOSIS — R7989 Other specified abnormal findings of blood chemistry: Secondary | ICD-10-CM | POA: Diagnosis not present

## 2024-05-22 DIAGNOSIS — Z713 Dietary counseling and surveillance: Secondary | ICD-10-CM | POA: Diagnosis not present

## 2024-05-31 ENCOUNTER — Other Ambulatory Visit

## 2024-06-01 ENCOUNTER — Ambulatory Visit: Payer: Self-pay | Admitting: Internal Medicine

## 2024-06-01 LAB — TSH: TSH: 0.57 m[IU]/L (ref 0.40–4.50)

## 2024-06-14 ENCOUNTER — Institutional Professional Consult (permissible substitution) (HOSPITAL_BASED_OUTPATIENT_CLINIC_OR_DEPARTMENT_OTHER): Admitting: Internal Medicine

## 2024-06-22 DIAGNOSIS — Z713 Dietary counseling and surveillance: Secondary | ICD-10-CM | POA: Diagnosis not present

## 2024-06-29 NOTE — Progress Notes (Deleted)
 Chief Complaint: Fatty liver  HPI:    [name] is a 43 year old male with a past medical history as listed below, who was referred to me by Waylan Almarie SAUNDERS, MD for a complaint of fatty liver.      06/2020 office visit for history of hepatic steatosis seen on abdominal ultrasound, negative ANA and AMA, ferritin negative, alpha-1 unremarkable, ceruloplasmin negative, negative hepatitis panel    06/30/2020 AST 70, ALT 122 and otherwise normal.    07/28/2020 ultrasound elastography of the liver with a K PA 3.5.  Negative for fibrosis.  Recommend follow-up in a year.    07/30/2022 office visit with Alan Coombs for fatty liver at that time discussed MASH with some metabolic dysfunction with triglycerides, 2021 ultrasound elastography unremarkable, LFTs were down from 2 years prior with normal platelets, fib 4 was 1 so unlikely to have fibrosis.  Recommended weight loss and alcohol cessation.  Referral to weight loss clinic.  Follow-up 6 months.    09/17/2023 abdominal ultrasound with heterogenous and increased hepatic echogenicity and seen in hepatic steatosis.  Past Medical History:  Diagnosis Date   High blood pressure    Hypothyroid     Past Surgical History:  Procedure Laterality Date   FOOT SURGERY  2013    Current Outpatient Medications  Medication Sig Dispense Refill   gemfibrozil (LOPID) 600 MG tablet Take 600 mg by mouth 2 (two) times daily.     ibuprofen (ADVIL,MOTRIN) 400 MG tablet Take 400 mg by mouth every 6 (six) hours as needed.     ipratropium (ATROVENT) 0.06 % nasal spray Place into both nostrils.     levothyroxine  (SYNTHROID ) 137 MCG tablet Take 1 tablet (137 mcg total) by mouth daily before breakfast. 90 tablet 3   lisinopril (ZESTRIL) 40 MG tablet Take 40 mg by mouth daily.     MULTIPLE VITAMIN PO Take 1 tablet by mouth daily.     No current facility-administered medications for this visit.    Allergies as of 07/02/2024   (No Known Allergies)    Family  History  Problem Relation Age of Onset   Healthy Mother    Other Father        unknown   Healthy Brother    Colon cancer Neg Hx    Stomach cancer Neg Hx    Esophageal cancer Neg Hx    Pancreatic cancer Neg Hx    Liver disease Neg Hx     Social History   Socioeconomic History   Marital status: Single    Spouse name: Not on file   Number of children: Not on file   Years of education: Not on file   Highest education level: Not on file  Occupational History   Not on file  Tobacco Use   Smoking status: Never   Smokeless tobacco: Never  Vaping Use   Vaping status: Never Used  Substance and Sexual Activity   Alcohol use: Yes    Alcohol/week: 12.0 standard drinks of alcohol    Types: 12 Standard drinks or equivalent per week   Drug use: No   Sexual activity: Not Currently  Other Topics Concern   Not on file  Social History Narrative   Not on file   Social Drivers of Health   Financial Resource Strain: Not on file  Food Insecurity: Not on file  Transportation Needs: Not on file  Physical Activity: Not on file  Stress: Not on file  Social Connections: Unknown (05/06/2022)   Received  from [facility]   Social Network    Social Network: Not on file  Intimate Partner Violence: Unknown (05/06/2022)   Received from [facility]   HITS    Physically Hurt: Not on file    Insult or Talk Down To: Not on file    Threaten Physical Harm: Not on file    Scream or Curse: Not on file    Review of Systems:    Constitutional: No weight loss, fever, chills, weakness or fatigue HEENT: Eyes: No change in vision               Ears, Nose, Throat:  No change in hearing or congestion Skin: No rash or itching Cardiovascular: No chest pain, chest pressure or palpitations   Respiratory: No SOB or cough Gastrointestinal: See HPI and otherwise negative Genitourinary: No dysuria or change in urinary frequency Neurological: No headache, dizziness or syncope Musculoskeletal: No new muscle  or joint pain Hematologic: No bleeding or bruising Psychiatric: No history of depression or anxiety    Physical Exam:  Vital signs: There were no vitals taken for this visit.  Constitutional:   Pleasant Caucasian male appears to be in NAD, Well developed, Well nourished, alert and cooperative Head:  Normocephalic and atraumatic. Eyes:   PEERL, EOMI. No icterus. Conjunctiva pink. Ears:  Normal auditory acuity. Neck:  Supple Throat: Oral cavity and pharynx without inflammation, swelling or lesion.  Respiratory: Respirations even and unlabored. Lungs clear to auscultation bilaterally.   No wheezes, crackles, or rhonchi.  Cardiovascular: Normal S1, S2. No MRG. Regular rate and rhythm. No peripheral edema, cyanosis or pallor.  Gastrointestinal:  Soft, nondistended, nontender. No rebound or guarding. Normal bowel sounds. No appreciable masses or hepatomegaly. Rectal:  Not performed.  Msk:  Symmetrical without gross deformities. Without edema, no deformity or joint abnormality.  Neurologic:  Alert and  oriented x4;  grossly normal neurologically.  Skin:   Dry and intact without significant lesions or rashes. Psychiatric: Oriented to person, place and time. Demonstrates good judgement and reason without abnormal affect or behaviors.  No recent labs or imaging.   Assessment: 1.  Fatty liver  Plan: 1. ***     Delon Failing, PA-C [facility] 06/29/2024, 8:42 AM  Cc: Waylan Almarie SAUNDERS, MD

## 2024-07-02 ENCOUNTER — Ambulatory Visit: Admitting: Physician Assistant

## 2024-07-03 ENCOUNTER — Institutional Professional Consult (permissible substitution) (HOSPITAL_BASED_OUTPATIENT_CLINIC_OR_DEPARTMENT_OTHER): Admitting: Internal Medicine

## 2024-08-14 DIAGNOSIS — Z713 Dietary counseling and surveillance: Secondary | ICD-10-CM | POA: Diagnosis not present

## 2024-08-28 ENCOUNTER — Ambulatory Visit: Admitting: Physician Assistant

## 2024-08-28 ENCOUNTER — Encounter: Payer: Self-pay | Admitting: Physician Assistant

## 2024-08-28 ENCOUNTER — Other Ambulatory Visit

## 2024-08-28 VITALS — BP 140/80 | HR 77 | Ht 71.0 in | Wt 241.6 lb

## 2024-08-28 DIAGNOSIS — K760 Fatty (change of) liver, not elsewhere classified: Secondary | ICD-10-CM | POA: Diagnosis not present

## 2024-08-28 LAB — COMPREHENSIVE METABOLIC PANEL WITH GFR
ALT: 175 U/L — ABNORMAL HIGH (ref 3–53)
AST: 125 U/L — ABNORMAL HIGH (ref 5–37)
Albumin: 4.3 g/dL (ref 3.5–5.2)
Alkaline Phosphatase: 49 U/L (ref 39–117)
BUN: 19 mg/dL (ref 6–23)
CO2: 29 meq/L (ref 19–32)
Calcium: 9.7 mg/dL (ref 8.4–10.5)
Chloride: 100 meq/L (ref 96–112)
Creatinine, Ser: 0.87 mg/dL (ref 0.40–1.50)
GFR: 105.88 mL/min
Glucose, Bld: 95 mg/dL (ref 70–99)
Potassium: 4.1 meq/L (ref 3.5–5.1)
Sodium: 136 meq/L (ref 135–145)
Total Bilirubin: 0.7 mg/dL (ref 0.2–1.2)
Total Protein: 7.5 g/dL (ref 6.0–8.3)

## 2024-08-28 LAB — CBC WITH DIFFERENTIAL/PLATELET
Basophils Absolute: 0 K/uL (ref 0.0–0.1)
Basophils Relative: 0.8 % (ref 0.0–3.0)
Eosinophils Absolute: 0.2 K/uL (ref 0.0–0.7)
Eosinophils Relative: 4.6 % (ref 0.0–5.0)
HCT: 46.9 % (ref 39.0–52.0)
Hemoglobin: 15.9 g/dL (ref 13.0–17.0)
Lymphocytes Relative: 29.2 % (ref 12.0–46.0)
Lymphs Abs: 1.3 K/uL (ref 0.7–4.0)
MCHC: 33.9 g/dL (ref 30.0–36.0)
MCV: 95.6 fl (ref 78.0–100.0)
Monocytes Absolute: 0.7 K/uL (ref 0.1–1.0)
Monocytes Relative: 14.3 % — ABNORMAL HIGH (ref 3.0–12.0)
Neutro Abs: 2.3 K/uL (ref 1.4–7.7)
Neutrophils Relative %: 51.1 % (ref 43.0–77.0)
Platelets: 213 K/uL (ref 150.0–400.0)
RBC: 4.91 Mil/uL (ref 4.22–5.81)
RDW: 12.8 % (ref 11.5–15.5)
WBC: 4.6 K/uL (ref 4.0–10.5)

## 2024-08-28 NOTE — Patient Instructions (Signed)
 _______________________________________________________  If your blood pressure at your visit was 140/90 or greater, please contact your primary care physician to follow up on this.  _______________________________________________________  If you are age 44 or older, your body mass index should be between 23-30. Your Body mass index is 33.7 kg/m. If this is out of the aforementioned range listed, please consider follow up with your Primary Care Provider.  If you are age 8 or younger, your body mass index should be between 19-25. Your Body mass index is 33.7 kg/m. If this is out of the aformentioned range listed, please consider follow up with your Primary Care Provider.   ________________________________________________________  The [facility] providers would like to encourage you to use MYCHART to communicate with providers for non-urgent requests or questions.  Due to long hold times on the telephone, sending your provider a message by [facility] may be a faster and more efficient way to get a response.  Please allow 48 business hours for a response.  Please remember that this is for non-urgent requests.  _______________________________________________________  [facility] is using a team-based approach to care.  Your team is made up of your doctor and two to three APPS. Our APPS (Nurse Practitioners and Physician Assistants) work with your physician to ensure care continuity for you. They are fully qualified to address your health concerns and develop a treatment plan. They communicate directly with your gastroenterologist to care for you. Seeing the Advanced Practice Practitioners on your physician's team can help you by facilitating care more promptly, often allowing for earlier appointments, access to diagnostic testing, procedures, and other specialty referrals.   Your provider has requested that you go to the basement level for lab work before leaving today. Press B on the  elevator. The lab is located at the first door on the left as you exit the elevator.  You have been scheduled for an abdominal ultrasound at [facility] (1st floor of hospital) on 09-05-24 at 8am. Please arrive 30 minutes prior to your appointment for registration. Make certain not to have anything to eat or drink 6 hours prior to your appointment. Should you need to reschedule your appointment, please contact radiology at [phone]. This test typically takes about 30 minutes to perform.   Due to recent changes in healthcare laws, you may see the results of your imaging and laboratory studies on MyChart before your provider has had a chance to review them.  We understand that in some cases there may be results that are confusing or concerning to you. Not all laboratory results come back in the same time frame and the provider may be waiting for multiple results in order to interpret others.  Please give us  48 hours in order for your provider to thoroughly review all the results before contacting the office for clarification of your results.   Thank you for entrusting me with your care and choosing [facility].  Delon Failing, PA-C

## 2024-08-28 NOTE — Progress Notes (Signed)
 "  Chief Complaint: Follow-up fatty liver  HPI:    [name] is a 44 year old male with a past medical history as listed below including fatty liver, known to Dr. Shila, who presents to clinic today for fatty liver.   06/2020 OV history of hepatic steatosis seen on abdominal ultrasound.  Negative ANA, AMA.  Ferritin negative, alpha 1 unremarkable, ceruloplasmin negative.  Negative hepatitis panel. 07/2020 ultrasound elastography kPA less than 5 probably normal    07/30/2022 patient seen in clinic by Alan Coombs, PA-C for MASH, some metabolic dysfunction with triglycerides.  2021 ultrasound elastography unremarkable.  LFTs were down from 2 years ago with normal platelets.  Fib 4 was 1.  Unlikely to have fibrosis.  Denied referral to weight loss clinic.  Told to check up in 6 months for LFTs, weight check and consider elastography.  Noted BMI 35.15.    03/16/2024 abdominal ultrasound with heterogenous and increased hepatic echogenicity as can be seen in hepatic steatosis.    Today, patient tells me he is just here for follow-up of his fatty liver.  He was unaware that his liver enzymes have not been being checked by his primary care provider.  In general he tells me that he is actually gained about 15 pounds over the past couple of months.  He has also developed hypertension and has a consult with cardiology coming up in January.  Otherwise no new complaints or concerns.    Denies fever, chills, nausea, vomiting or abdominal pain.  Past Medical History:  Diagnosis Date   High blood pressure    Hypothyroid     Past Surgical History:  Procedure Laterality Date   FOOT SURGERY  2013    Current Outpatient Medications  Medication Sig Dispense Refill   gemfibrozil (LOPID) 600 MG tablet Take 600 mg by mouth 2 (two) times daily.     ibuprofen (ADVIL,MOTRIN) 400 MG tablet Take 400 mg by mouth every 6 (six) hours as needed.     ipratropium (ATROVENT) 0.06 % nasal spray Place into both nostrils.      levothyroxine  (SYNTHROID ) 137 MCG tablet Take 1 tablet (137 mcg total) by mouth daily before breakfast. 90 tablet 3   lisinopril (ZESTRIL) 40 MG tablet Take 40 mg by mouth daily.     MULTIPLE VITAMIN PO Take 1 tablet by mouth daily.     No current facility-administered medications for this visit.    Allergies as of 08/28/2024   (No Known Allergies)    Family History  Problem Relation Age of Onset   Healthy Mother    Other Father        unknown   Healthy Brother    Colon cancer Neg Hx    Stomach cancer Neg Hx    Esophageal cancer Neg Hx    Pancreatic cancer Neg Hx    Liver disease Neg Hx     Social History   Socioeconomic History   Marital status: Single    Spouse name: Not on file   Number of children: Not on file   Years of education: Not on file   Highest education level: Not on file  Occupational History   Not on file  Tobacco Use   Smoking status: Never   Smokeless tobacco: Never  Vaping Use   Vaping status: Never Used  Substance and Sexual Activity   Alcohol use: Yes    Alcohol/week: 12.0 standard drinks of alcohol    Types: 12 Standard drinks or equivalent per week  Drug use: No   Sexual activity: Not Currently  Other Topics Concern   Not on file  Social History Narrative   Not on file   Social Drivers of Health   Tobacco Use: Low Risk (04/03/2024)   Patient History    Smoking Tobacco Use: Never    Smokeless Tobacco Use: Never    Passive Exposure: Not on file  Financial Resource Strain: Not on file  Food Insecurity: Not on file  Transportation Needs: Not on file  Physical Activity: Not on file  Stress: Not on file  Social Connections: Not on file  Intimate Partner Violence: Not on file  Depression (EYV7-0): Not on file  Alcohol Screen: Not on file  Housing: Not on file  Utilities: Not on file  Health Literacy: Not on file    Review of Systems:    Constitutional: No weight loss, fever or chills Cardiovascular: No chest pain    Respiratory: No SOB  Gastrointestinal: See HPI and otherwise negative   Physical Exam:  Vital signs: BP (!) 140/80   Pulse 77   Ht 5' 11 (1.803 m)   Wt 241 lb 9.6 oz (109.6 kg)   BMI 33.70 kg/m    Constitutional:   Pleasant obese Caucasian male appears to be in NAD, Well developed, Well nourished, alert and cooperative Respiratory: Respirations even and unlabored. Lungs clear to auscultation bilaterally.   No wheezes, crackles, or rhonchi.  Cardiovascular: Normal S1, S2. No MRG. Regular rate and rhythm. No peripheral edema, cyanosis or pallor.  Gastrointestinal:  Soft, nondistended, nontender. No rebound or guarding. Normal bowel sounds. No appreciable masses or hepatomegaly. Rectal:  Not performed.  Psychiatric: Oriented to person, place and time. Demonstrates good judgement and reason without abnormal affect or behaviors.  No recent labs.  See HPI for imaging.   Assessment: 1.  Fatty liver: Initially diagnosed in 2021, recent ultrasound in July of last year continue to show hepatic steatosis, no recent elastography, no recent labs, cannot calculate fib 4 score today, patient has gained weight at least 15 pounds and developed hypertension  Plan: 1.  Discussed the possibility of [facility] pending labs and fibrosis score. 2.  Ordered labs today to include a CBC and CMP 3.  Repeat ultrasound of the elastography 4.  Recommend slow and steady weight loss. 5.  Patient to follow in clinic in 2 months or sooner if necessary.  Delon Failing, PA-C [facility] 08/28/2024, 9:59 AM  Cc: Todd Almarie SAUNDERS, MD  "

## 2024-08-29 ENCOUNTER — Ambulatory Visit: Payer: Self-pay | Admitting: Physician Assistant

## 2024-08-30 ENCOUNTER — Encounter (HOSPITAL_BASED_OUTPATIENT_CLINIC_OR_DEPARTMENT_OTHER): Payer: Self-pay | Admitting: Internal Medicine

## 2024-08-30 ENCOUNTER — Ambulatory Visit (HOSPITAL_BASED_OUTPATIENT_CLINIC_OR_DEPARTMENT_OTHER): Admitting: Internal Medicine

## 2024-08-30 VITALS — BP 128/90 | HR 73 | Ht 71.0 in | Wt 241.0 lb

## 2024-08-30 DIAGNOSIS — F1090 Alcohol use, unspecified, uncomplicated: Secondary | ICD-10-CM | POA: Diagnosis not present

## 2024-08-30 DIAGNOSIS — E063 Autoimmune thyroiditis: Secondary | ICD-10-CM

## 2024-08-30 DIAGNOSIS — R748 Abnormal levels of other serum enzymes: Secondary | ICD-10-CM

## 2024-08-30 DIAGNOSIS — E781 Pure hyperglyceridemia: Secondary | ICD-10-CM | POA: Diagnosis not present

## 2024-08-30 NOTE — Patient Instructions (Signed)
 Medication Instructions:  No changes *If you need a refill on your cardiac medications before your next appointment, please call your pharmacy*  Lab Work: None  Testing/Procedures: none  Follow-Up: As needed

## 2024-08-30 NOTE — Progress Notes (Signed)
 "   LIPID CLINIC CONSULT NOTE  Chief Complaint:  High triglycerides  Primary Care Physician: Waylan Almarie SAUNDERS, MD  Primary Cardiologist:  None  HPI:  Todd Obrien is a 44 y.o. male who is being seen today for the evaluation of high triglycerides at the request of Waylan Almarie SAUNDERS, MD. This is a pleasant 44 year old male kindly referred for evaluation management of dyslipidemia.  Primarily is here for evaluation of hypertriglyceridemia.  I reviewed the referral note from his primary care provider.  This indicated that in the past he has failed fibrate to help with his triglycerides.  He was apparently changed to Actos/pioglitazone with improvement in triglycerides from 306-154, however he says he has never taken that medicine to his knowledge.  Currently he is on gemfibrozil 600 mg twice daily.  He did have recent lab work including cholesterol which showed total 171, HDL 47, triglycerides 154 and LDL 98.  Hs-CRP was elevated at 4.  APO B is high at 102.  Hemoglobin A1c 5.3%.  He does have elevated liver enzymes at 77 and 129 for AST/ALT.  He has an upcoming liver FibroScan and is seeing GI with a plan to possibly start [facility].  Of note, he does drink alcohol regularly several times a week and perhaps more on the weekends.  I think this is contributing a lot to his high triglycerides and may be impacting his fatty liver disease.  PMHx:  Past Medical History:  Diagnosis Date   High blood pressure    Hypothyroid     Past Surgical History:  Procedure Laterality Date   FOOT SURGERY  2013    FAMHx:  Family History  Problem Relation Age of Onset   Healthy Mother    Other Father        unknown   Healthy Brother    Colon cancer Neg Hx    Stomach cancer Neg Hx    Esophageal cancer Neg Hx    Pancreatic cancer Neg Hx    Liver disease Neg Hx     SOCHx:   reports that he has never smoked. He has never used smokeless tobacco. He reports current alcohol use of about 12.0 standard  drinks of alcohol per week. He reports that he does not use drugs.  ALLERGIES:  Allergies[1]  ROS: Pertinent items noted in HPI and remainder of comprehensive ROS otherwise negative.  HOME MEDS: Medications Ordered Prior to Encounter[2]  LABS/IMAGING: No results found for this or any previous visit (from the past 48 hours). No results found.  LIPID PANEL: No results found for: CHOL, TRIG, HDL, CHOLHDL, VLDL, LDLCALC, LDLDIRECT  No results found for: LIPOA   WEIGHTS: Wt Readings from Last 3 Encounters:  08/30/24 241 lb (109.3 kg)  08/28/24 241 lb 9.6 oz (109.6 kg)  04/03/24 234 lb 12.8 oz (106.5 kg)    VITALS: BP (!) 128/90   Pulse 73   Ht 5' 11 (1.803 m)   Wt 241 lb (109.3 kg)   SpO2 97%   BMI 33.61 kg/m   EXAM: Deferred  EKG: Deferred  ASSESSMENT: Hypertriglyceridemia Regular alcohol use Hypothyroidism Elevated liver enzymes  PLAN: 1.   Mr. Betancur has hypertriglyceridemia although recently his triglycerides have improved substantially from the 300s down to about normal.  The goal of triglyceride management therapy is not to achieve a value necessarily below 150 however to try to keep his triglycerides below 500, level which would be associated with increased risk of pancreatitis.  He seems to be  tolerating gemfibrozil.  Other options for therapy include Lovaza or Vascepa which are prescription omega-3 fatty acids and/or statins.  Obviously with his elevated liver enzymes statins would have to be used cautiously.  I would not recommend that at this time although his APO B and LDL levels are still elevated.  I would like to see if he has any liver fibrosis or could qualify for [facility] as I think this would be very helpful to reduce his cholesterol and triglycerides as would alcohol cessation which we discussed today as well.  Thanks again for the kind referral.  I am happy to see him back on an as needed basis.  Vinie KYM Maxcy, MD, [facility], FNLA,  FACP  [place]  [facility]  Medical Director of the Advanced Lipid Disorders &  Cardiovascular Risk Reduction Clinic Diplomate of the American Board of Clinical Lipidology Attending Cardiologist  Direct Dial: [phone]  Fax: [phone]  Website:  [email or web address]  Vinie JAYSON Maxcy 08/30/2024, 3:55 PM     [1] No Known Allergies [2]  Current Outpatient Medications on File Prior to Visit  Medication Sig Dispense Refill   gemfibrozil (LOPID) 600 MG tablet Take 600 mg by mouth 2 (two) times daily.     ibuprofen (ADVIL,MOTRIN) 400 MG tablet Take 400 mg by mouth every 6 (six) hours as needed.     ipratropium (ATROVENT) 0.06 % nasal spray Place into both nostrils.     levothyroxine  (SYNTHROID ) 137 MCG tablet Take 1 tablet (137 mcg total) by mouth daily before breakfast. 90 tablet 3   lisinopril (ZESTRIL) 40 MG tablet Take 40 mg by mouth daily.     MULTIPLE VITAMIN PO Take 1 tablet by mouth daily.     No current facility-administered medications on file prior to visit.   "

## 2024-09-05 ENCOUNTER — Ambulatory Visit (HOSPITAL_COMMUNITY)
Admission: RE | Admit: 2024-09-05 | Discharge: 2024-09-05 | Disposition: A | Discharge status: Home / Self Care | Source: Ambulatory Visit | Attending: Physician Assistant | Admitting: Physician Assistant

## 2024-09-05 DIAGNOSIS — K760 Fatty (change of) liver, not elsewhere classified: Secondary | ICD-10-CM | POA: Diagnosis present

## 2024-09-18 ENCOUNTER — Other Ambulatory Visit: Payer: Self-pay

## 2024-09-18 ENCOUNTER — Encounter: Payer: Self-pay | Admitting: Internal Medicine

## 2024-09-18 MED ORDER — LEVOTHYROXINE SODIUM 137 MCG PO TABS: 137.0000 ug | ORAL_TABLET | Freq: Every day | ORAL | 3 refills | Status: AC

## 2024-09-18 NOTE — Progress Notes (Unsigned)
 "   Name: Todd Obrien  MRN/ DOB: [id number], [birth year]    Age/ Sex: 44 y.o., male    PCP: Waylan Almarie SAUNDERS, MD   Reason for Endocrinology Evaluation: Hashimoto's thyroiditis     Date of Initial Endocrinology Evaluation: 03/25/2023    HPI: [name] is a 44 y.o. male with a past medical history of Hashimoto's thyroiditis, dyslipidemia. The patient presented for initial endocrinology clinic visit on 03/25/2023  for consultative assistance with his Hypothyroidism.   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  Patient continues with weight loss No local neck swelling  Denies palpitations Denies constipation and diarrhea No tremors   Levothyroxine  137 mcg daily  HISTORY:  Past Medical History:  Past Medical History:  Diagnosis Date   High blood pressure    Hypothyroid    Past Surgical History:  Past Surgical History:  Procedure Laterality Date   FOOT SURGERY  2013    Social History:  reports that he has never smoked. He has never used smokeless tobacco. He reports current alcohol use of about 12.0 standard drinks of alcohol per week. He reports that he does not use drugs. Family History: family history includes Healthy in his brother and mother; Other in his father.   HOME MEDICATIONS: Allergies as of 09/18/2024   No Known Allergies      Medication List        Accurate as of September 18, 2024  7:08 AM. If you have any questions, ask your nurse or doctor.          gemfibrozil 600 MG tablet Commonly known as: LOPID Take 600 mg by mouth 2 (two) times daily.   ibuprofen 400 MG tablet Commonly known  as: ADVIL Take 400 mg by mouth every 6 (six) hours as needed.   ipratropium 0.06 % nasal spray Commonly known as: ATROVENT Place into both nostrils.   levothyroxine  137 MCG tablet Commonly known as: SYNTHROID  Take 1 tablet (137 mcg total) by mouth daily before breakfast.   lisinopril 40 MG tablet Commonly known as: ZESTRIL Take 40 mg by mouth daily.   MULTIPLE VITAMIN PO Take 1 tablet by mouth daily.          REVIEW OF SYSTEMS: A comprehensive ROS was conducted with the patient and is negative except as per HPI     OBJECTIVE:  VS: There were no vitals taken for this visit.   Wt Readings from Last 3 Encounters:  08/30/24 241 lb (109.3 kg)  08/28/24 241 lb 9.6 oz (109.6 kg)  04/03/24 234 lb 12.8 oz (106.5 kg)     EXAM: General: Pt appears well and is in NAD  Neck: General: Supple without adenopathy. Thyroid : Thyroid  size normal.  No goiter or nodules appreciated  Lungs: Clear with good BS bilat   Heart: Auscultation: RRR.  Abdomen: Soft, nontender  Extremities:  BL LE: No pretibial edema   Mental Status: Judgment, insight: Intact Orientation: Oriented to time, place, and person Mood and affect: No depression, anxiety, or agitation     DATA REVIEWED:  Latest Reference Range & Units 09/28/23 09:12  Vitamin D , 25-Hydroxy 30 -  100 ng/mL 24 (L)  TSH 0.40 - 4.50 mIU/L 0.19 (L)  T4,Free(Direct) 0.8 - 1.8 ng/dL 1.2     Old records , labs and images have been reviewed.   ASSESSMENT/PLAN/RECOMMENDATIONS:   Hashimoto's thyroiditis  - Pt is clinically euthyroid  - Pt educated extensively on the correct way to take levothyroxine  (first thing in the morning with water, 30 minutes before eating or taking other medications). - Pt encouraged to double dose the following day if she were to miss a dose given long half-life of levothyroxine . - His TSH has increased from 0.19 u IU/ML to 43.51 uIU/mL by changing the dose from levothyroxine  150 mcg to 137 mcg. - I  contacted the pharmacy and they verified that the last pickup was March 4,2025 @ 90 tabs of levothyroxine  150 mcg, per pharmacist that had prepared 2 refills but these were never picked up - I have contacted the patient and encourage compliance, the patient states that he takes his medications daily, I did advise the patient to obtain a pillbox because it appears that he is missing plenty of doses without being aware of it.  We discussed the risk of myxedema coma and the importance of optimizing TFT levels - Patient will return on October, ninth for repeat TFTs  Medications :   Levothyroxine  137 mcg daily      F/U in 6 months   Signed electronically by: Stefano Redgie Butts, MD  [facility]  [facility] [address] [place], [place] [address] Phone: [phone] FAX: [phone]   CC: Waylan Almarie SAUNDERS, MD [address] [place] [address] Phone: [phone] Fax: [phone]   Return to Endocrinology clinic as below: Future Appointments  Date Time Provider Department Center  09/18/2024  9:10 AM Obrien Kuan, Donell Redgie, MD [facility] None  11/01/2024  3:00 PM Beather Delon Gibson, PA [facility]           "

## 2024-11-01 ENCOUNTER — Ambulatory Visit: Admitting: Physician Assistant

## 2024-12-11 ENCOUNTER — Ambulatory Visit: Admitting: Internal Medicine
